# Patient Record
Sex: Male | Born: 1945
Health system: Southern US, Community
[De-identification: ages and names within clinical notes are randomized; demographics above are authoritative.]

## PROBLEM LIST (undated history)

## (undated) DIAGNOSIS — E7800 Pure hypercholesterolemia, unspecified: Secondary | ICD-10-CM

## (undated) DIAGNOSIS — I2510 Atherosclerotic heart disease of native coronary artery without angina pectoris: Secondary | ICD-10-CM

## (undated) DIAGNOSIS — N400 Enlarged prostate without lower urinary tract symptoms: Secondary | ICD-10-CM

## (undated) DIAGNOSIS — I10 Essential (primary) hypertension: Secondary | ICD-10-CM

## (undated) DIAGNOSIS — Z9861 Coronary angioplasty status: Secondary | ICD-10-CM

## (undated) HISTORY — PX: CARDIAC CATHETERIZATION: SHX172

## (undated) HISTORY — PX: HERNIA REPAIR: SHX51

## (undated) HISTORY — DX: Essential (primary) hypertension: I10

---

## 2001-06-22 ENCOUNTER — Encounter: Payer: Self-pay | Admitting: General Surgery

## 2001-06-22 ENCOUNTER — Ambulatory Visit (HOSPITAL_COMMUNITY): Admission: RE | Admit: 2001-06-22 | Discharge: 2001-06-22 | Payer: Self-pay | Admitting: General Surgery

## 2009-07-05 ENCOUNTER — Encounter: Admission: RE | Admit: 2009-07-05 | Discharge: 2009-07-05 | Payer: Self-pay | Admitting: Gastroenterology

## 2011-05-02 NOTE — Op Note (Signed)
[facility]  Patient:    Matthew Henderson, Matthew Henderson                     MRN: [id number] Proc. Date: 06/22/01 Adm. Date:  [id number] Attending:  Glenna Fellows Tappan                           Operative Report  PREOPERATIVE DIAGNOSIS:  Right inguinal hernia.  POSTOPERATIVE DIAGNOSIS:  Right inguinal hernia.  PROCEDURE:  Repair of right inguinal hernia.  SURGEON:  Lorne Skeens. Hoxworth, M.D.  ANESTHESIA:  Local with IV sedation.  BRIEF HISTORY:  [name] is a 65 year old black male who presents with an enlarging symptomatic right inguinal hernia.  Repair and local anesthesia with sedation using mesh has been recommended and accepted.  The nature of the procedure, indications, the risks of bleeding, infection, and recurrence were discussed and understood preoperatively.  He is now brought to the operating room for this procedure.  DESCRIPTION OF PROCEDURE:  The patient was brought to the operating room and placed in supine position on the operating room table and IV sedation was administered.  Antibiotics were given intravenously.  The right groin was sterilely prepped and draped.  Local anesthesia was used and infiltrated in the area of the incision and blocked the ilioinguinal nerve.  An oblique incision was made in the right groin and dissection carried down through the subcutaneous tissue.  Crossing veins were doubly clamped, divided, and ligated with 3-0 Vicryl.  The external oblique was identified, anesthetized, and divided along the lines of its fibers to the external ring. The ilioinguinal nerve was identified, dissected free, and protected.  The cord was dissected away from the floor of the pubic tubercle and cremasteric attachments divided.  There was a very large direct defect encompassing the entire floor.  The direct hernia content was dissected away from surrounding soft tissue and completely freed from the cord.  Following this,  the attenuated transversalis fascia was closed with running 2-0 Prolene, holding the hernia reduced.  There was no indirect component.  A piece of Prolene mesh was trimmed to size to fit the floor of the canal with tails around the cord and internal ring.  It was sutured initially to the pubic tubercle and then to the inguinal ligament with running 2-0 Prolene.  Immediately the mesh was sutured to the edge of the rectus sheath with interrupted 2-0 Prolene.  The tails were then packed together lateral to the cord with interrupted Prolene creating a new internal ring snug to a fingertip.  This provided nice wide coverage of the direct and indirect spaces.  The wound was irrigated and inspected for hemostasis which was complete.  The cord and ilioinguinal nerves were returned to the anatomic position.  The external oblique was closed with running 3-0 Vicryl, Scarpas fascia was closed with running 3-0 Vicryl, and the skin with running subcuticular 4-0 Monocryl and Steri-Strips.  Sponge and instrument counts correct.  A dry sterile dressing was applied and the patient was taken to the recovery room in good condition. DD:  06/22/01 TD:  06/22/01 Job: [id number]

## 2013-06-07 ENCOUNTER — Emergency Department (HOSPITAL_COMMUNITY): Payer: BC Managed Care – PPO

## 2013-06-07 ENCOUNTER — Emergency Department (HOSPITAL_COMMUNITY)
Admission: EM | Admit: 2013-06-07 | Discharge: 2013-06-07 | Disposition: A | Discharge status: Home / Self Care | Payer: BC Managed Care – PPO | Attending: Emergency Medicine | Admitting: Emergency Medicine

## 2013-06-07 ENCOUNTER — Encounter (HOSPITAL_COMMUNITY): Payer: Self-pay | Admitting: *Deleted

## 2013-06-07 DIAGNOSIS — Z7982 Long term (current) use of aspirin: Secondary | ICD-10-CM | POA: Insufficient documentation

## 2013-06-07 DIAGNOSIS — R42 Dizziness and giddiness: Secondary | ICD-10-CM | POA: Insufficient documentation

## 2013-06-07 DIAGNOSIS — E7800 Pure hypercholesterolemia, unspecified: Secondary | ICD-10-CM | POA: Insufficient documentation

## 2013-06-07 DIAGNOSIS — R110 Nausea: Secondary | ICD-10-CM | POA: Insufficient documentation

## 2013-06-07 DIAGNOSIS — Z79899 Other long term (current) drug therapy: Secondary | ICD-10-CM | POA: Insufficient documentation

## 2013-06-07 HISTORY — DX: Pure hypercholesterolemia, unspecified: E78.00

## 2013-06-07 LAB — POCT I-STAT, CHEM 8
Calcium, Ion: 1.11 mmol/L — ABNORMAL LOW (ref 1.13–1.30)
Glucose, Bld: 149 mg/dL — ABNORMAL HIGH (ref 70–99)
HCT: 47 % (ref 39.0–52.0)
Hemoglobin: 16 g/dL (ref 13.0–17.0)
TCO2: 22 mmol/L (ref 0–100)

## 2013-06-07 LAB — POCT I-STAT TROPONIN I: Troponin i, poc: 0.01 ng/mL (ref 0.00–0.08)

## 2013-06-07 MED ORDER — MECLIZINE HCL 50 MG PO TABS: 50.0000 mg | ORAL_TABLET | Freq: Three times a day (TID) | ORAL | Status: DC | PRN

## 2013-06-07 MED ORDER — MECLIZINE HCL 25 MG PO TABS
25.0000 mg | ORAL_TABLET | Freq: Once | ORAL | Status: AC
  Administered 2013-06-07: 25 mg via ORAL
  Filled 2013-06-07: qty 1

## 2013-06-07 NOTE — ED Notes (Signed)
Phlebotomy at bedside.

## 2013-06-07 NOTE — ED Notes (Signed)
MD at bedside. 

## 2013-06-07 NOTE — ED Notes (Signed)
Pt reports dizziness has resolved slightly.

## 2013-06-07 NOTE — ED Notes (Signed)
To ED for eval after waking with severe dizziness. States if he moves his head side to side the dizziness becomes extreme. Denies CP. Denies SOB. Denies HA. Denies visual changes. Speech is clear. No arm drift noted. Pupils equal.

## 2013-06-07 NOTE — ED Provider Notes (Signed)
History    CSN: [id number] Arrival date & time 06/07/13  0865  First MD Initiated Contact with Patient 06/07/13 1044     Chief Complaint  Patient presents with  . Dizziness   (Consider location/radiation/quality/duration/timing/severity/associated sxs/prior Treatment) HPI  Patient presents today complaining of dizzines consisting of there room off balance causing balance difficulties since awakening this a.m.   Some mild nausea no vomiting.  Worsens with head movement and walking.  NO numbness, tingling weakness.  Patient has not had similar episodes in past.  No history of stroke or tia.  No visual changes.  Patient denies nasal congestion or recent uri.  Problem has been present for several hours, waxing and waning.  No treatments given.   Past Medical History  Diagnosis Date  . Hypercholesteremia    History reviewed. No pertinent past surgical history. History reviewed. No pertinent family history. History  Substance Use Topics  . Smoking status: Never Smoker   . Smokeless tobacco: Not on file  . Alcohol Use: No    Review of Systems  All other systems reviewed and are negative.    Allergies  Review of patient's allergies indicates not on file.  Home Medications   Current Outpatient Rx  Name  Route  Sig  Dispense  Refill  . aspirin EC 81 MG tablet   Oral   Take 81 mg by mouth daily.         [name] dutasteride (AVODART) 0.5 MG capsule   Oral   Take 0.5 mg by mouth daily.         [name] ezetimibe-simvastatin (VYTORIN) 10-80 MG per tablet   Oral   Take 1 tablet by mouth daily.         [name] ibuprofen (ADVIL,MOTRIN) 200 MG tablet   Oral   Take 400 mg by mouth every 6 (six) hours as needed for pain.          BP 178/96  Pulse 95  Temp(Src) 97.8 F (36.6 C) (Oral)  Resp 18  Ht 6' (1.829 m)  Wt 254 lb (115.214 kg)  BMI 34.44 kg/m2  SpO2 99% Physical Exam  Nursing note and vitals reviewed. Constitutional: He is oriented to person, place, and time. He appears  well-developed and well-nourished.  HENT:  Head: Normocephalic and atraumatic.  Right Ear: Tympanic membrane and external ear normal.  Left Ear: Tympanic membrane and external ear normal.  Nose: Nose normal. Right sinus exhibits no maxillary sinus tenderness and no frontal sinus tenderness. Left sinus exhibits no maxillary sinus tenderness and no frontal sinus tenderness.  Eyes: Conjunctivae and EOM are normal. Pupils are equal, round, and reactive to light. Right eye exhibits no nystagmus. Left eye exhibits no nystagmus.  Neck: Normal range of motion. Neck supple.  Cardiovascular: Normal rate, regular rhythm, normal heart sounds and intact distal pulses.   Pulmonary/Chest: Effort normal and breath sounds normal. No respiratory distress. He exhibits no tenderness.  Abdominal: Soft. Bowel sounds are normal. He exhibits no distension and no mass. There is no tenderness.  Musculoskeletal: Normal range of motion. He exhibits no edema and no tenderness.  Neurological: He is alert and oriented to person, place, and time. He has normal strength and normal reflexes. No sensory deficit. He displays a negative Romberg sign. GCS eye subscore is 4. GCS verbal subscore is 5. GCS motor subscore is 6.  Reflex Scores:      Tricep reflexes are 2+ on the right side and 2+ on the left side.  Bicep reflexes are 2+ on the right side and 2+ on the left side.      Brachioradialis reflexes are 2+ on the right side and 2+ on the left side.      Patellar reflexes are 2+ on the right side and 2+ on the left side.      Achilles reflexes are 2+ on the right side and 2+ on the left side. Patient with normal gait without ataxia, shuffling, spasm, or antalgia. Speech is normal without dysarthria, dysphasia, or aphasia. Muscle strength is 5/5 in bilateral shoulders, elbow flexor and extensors, wrist flexor and extensors, and intrinsic hand muscles. 5/5 bilateral lower extremity hip flexors, extensors, knee flexors and  extensors, and ankle dorsi and plantar flexors.    Skin: Skin is warm and dry. No rash noted.  Psychiatric: He has a normal mood and affect. His behavior is normal. Judgment and thought content normal.    ED Course  Procedures (including critical care time) Labs Reviewed  POCT I-STAT, CHEM 8 - Abnormal; Notable for the following:    Glucose, Bld 149 (*)    Calcium, Ion 1.11 (*)    All other components within normal limits  POCT I-STAT TROPONIN I   Ct Head Wo Contrast  06/07/2013   *RADIOLOGY REPORT*  Clinical Data: Workup with severe dizziness  CT HEAD WITHOUT CONTRAST  Technique:  Contiguous axial images were obtained from the base of the skull through the vertex without contrast.  Comparison: None.  Findings: No acute intracranial hemorrhage.  No focal mass lesion. No CT evidence of acute infarction.   No midline shift or mass effect.  No hydrocephalus.  Basilar cisterns are patent.  Paranasal sinuses and mastoid air cells are clear.  Orbits are normal.  There is mild subcortical white matter hypodensities.  IMPRESSION: No acute intracranial findings.  Mild microvascular white matter disease.   Original Report Authenticated By: Genevive Bi, M.D.   Mr Brain Wo Contrast  06/07/2013   *RADIOLOGY REPORT*  Clinical Data: Dizziness.  Nausea.  Evaluate for vertebrobasilar insufficiency.  MRI HEAD WITHOUT CONTRAST  Technique:  Multiplanar, multiecho pulse sequences of the brain and surrounding structures were obtained according to standard protocol without intravenous contrast.  Comparison: CT head earlier today.  Findings: There is no evidence for acute infarction, intracranial hemorrhage, mass lesion, hydrocephalus, or extra-axial fluid. There is no cerebral or cerebellar atrophy.  Increased signal in the periventricular and subcortical white matter are noted, likely chronic microvascular ischemic change. There are numerous foci of T2* effect on GRE sequence in the supratentorial subcortical white  matter, along with marked prominence perivascular spaces suggesting hypertensive related cerebrovascular disease.  No evidence for hypertensive encephalopathy.  Calvarium is intact.  Skull base and upper cervical region unremarkable.  Normal pituitary and cerebellar tonsils.  No midline shift.  Flow voids are maintained in the major intracranial vessels which are moderate dolichoectatic.  Negative orbits.  Sinuses and mastoids clear.  IMPRESSION: No acute intracranial findings.  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  No evidence for hypertensive encephalopathy.   Original Report Authenticated By: Davonna Belling, M.D.   No diagnosis found.  MDM  Vertigo Plan f/u for hypertension and antivert.   Hilario Quarry, MD 06/09/13 [id number]

## 2014-07-10 ENCOUNTER — Other Ambulatory Visit: Payer: Self-pay | Admitting: Gastroenterology

## 2014-07-10 DIAGNOSIS — R195 Other fecal abnormalities: Secondary | ICD-10-CM

## 2014-07-13 ENCOUNTER — Other Ambulatory Visit: Payer: Self-pay | Admitting: Gastroenterology

## 2014-07-13 DIAGNOSIS — R195 Other fecal abnormalities: Secondary | ICD-10-CM

## 2014-07-13 DIAGNOSIS — K562 Volvulus: Secondary | ICD-10-CM

## 2014-07-18 ENCOUNTER — Ambulatory Visit
Admission: RE | Admit: 2014-07-18 | Discharge: 2014-07-18 | Disposition: A | Discharge status: Home / Self Care | Payer: Medicare Other | Source: Ambulatory Visit | Attending: Gastroenterology | Admitting: Gastroenterology

## 2014-07-18 DIAGNOSIS — R195 Other fecal abnormalities: Secondary | ICD-10-CM

## 2014-07-18 DIAGNOSIS — K562 Volvulus: Secondary | ICD-10-CM

## 2015-05-27 ENCOUNTER — Encounter (HOSPITAL_COMMUNITY): Payer: Self-pay | Admitting: Emergency Medicine

## 2015-05-27 ENCOUNTER — Emergency Department (HOSPITAL_COMMUNITY): Payer: Medicare Other

## 2015-05-27 ENCOUNTER — Emergency Department (HOSPITAL_COMMUNITY)
Admission: EM | Admit: 2015-05-27 | Discharge: 2015-05-27 | Disposition: A | Discharge status: Home / Self Care | Payer: Medicare Other | Attending: Emergency Medicine | Admitting: Emergency Medicine

## 2015-05-27 DIAGNOSIS — N400 Enlarged prostate without lower urinary tract symptoms: Secondary | ICD-10-CM | POA: Diagnosis not present

## 2015-05-27 DIAGNOSIS — Z7982 Long term (current) use of aspirin: Secondary | ICD-10-CM | POA: Insufficient documentation

## 2015-05-27 DIAGNOSIS — R61 Generalized hyperhidrosis: Secondary | ICD-10-CM

## 2015-05-27 DIAGNOSIS — R531 Weakness: Secondary | ICD-10-CM

## 2015-05-27 DIAGNOSIS — E780 Pure hypercholesterolemia: Secondary | ICD-10-CM | POA: Diagnosis not present

## 2015-05-27 DIAGNOSIS — Z79899 Other long term (current) drug therapy: Secondary | ICD-10-CM | POA: Diagnosis not present

## 2015-05-27 DIAGNOSIS — R110 Nausea: Secondary | ICD-10-CM | POA: Diagnosis present

## 2015-05-27 HISTORY — DX: Benign prostatic hyperplasia without lower urinary tract symptoms: N40.0

## 2015-05-27 LAB — URINE MICROSCOPIC-ADD ON

## 2015-05-27 LAB — URINALYSIS, ROUTINE W REFLEX MICROSCOPIC
BILIRUBIN URINE: NEGATIVE
GLUCOSE, UA: NEGATIVE mg/dL
HGB URINE DIPSTICK: NEGATIVE
Ketones, ur: NEGATIVE mg/dL
LEUKOCYTES UA: NEGATIVE
Nitrite: NEGATIVE
PH: 5.5 (ref 5.0–8.0)
PROTEIN: 30 mg/dL — AB
Specific Gravity, Urine: 1.021 (ref 1.005–1.030)
UROBILINOGEN UA: 1 mg/dL (ref 0.0–1.0)

## 2015-05-27 LAB — BASIC METABOLIC PANEL
Anion gap: 8 (ref 5–15)
BUN: 10 mg/dL (ref 6–20)
CO2: 23 mmol/L (ref 22–32)
CREATININE: 1.01 mg/dL (ref 0.61–1.24)
Calcium: 9.1 mg/dL (ref 8.9–10.3)
Chloride: 108 mmol/L (ref 101–111)
GFR calc Af Amer: >60 mL/min (ref >60)
GFR calc non Af Amer: >60 mL/min (ref >60)
Glucose, Bld: 112 mg/dL — ABNORMAL HIGH (ref 65–99)
POTASSIUM: 4.3 mmol/L (ref 3.5–5.1)
Sodium: 139 mmol/L (ref 135–145)

## 2015-05-27 LAB — CBC
HCT: 43.7 % (ref 39.0–52.0)
HEMOGLOBIN: 14.9 g/dL (ref 13.0–17.0)
MCH: 28.7 pg (ref 26.0–34.0)
MCHC: 34.1 g/dL (ref 30.0–36.0)
MCV: 84 fL (ref 78.0–100.0)
Platelets: 161 10*3/uL (ref 150–400)
RBC: 5.2 MIL/uL (ref 4.22–5.81)
RDW: 15 % (ref 11.5–15.5)
WBC: 7.6 10*3/uL (ref 4.0–10.5)

## 2015-05-27 LAB — I-STAT TROPONIN, ED
TROPONIN I, POC: 0.02 ng/mL (ref 0.00–0.08)
Troponin i, poc: 0.02 ng/mL (ref 0.00–0.08)

## 2015-05-27 LAB — CK: Total CK: 231 U/L (ref 49–397)

## 2015-05-27 LAB — I-STAT CG4 LACTIC ACID, ED
LACTIC ACID, VENOUS: 1.11 mmol/L (ref 0.5–2.0)
Lactic Acid, Venous: 1.42 mmol/L (ref 0.5–2.0)

## 2015-05-27 MED ORDER — SODIUM CHLORIDE 0.9 % IV BOLUS (SEPSIS)
1000.0000 mL | Freq: Once | INTRAVENOUS | Status: AC
  Administered 2015-05-27: 1000 mL via INTRAVENOUS

## 2015-05-27 NOTE — ED Notes (Signed)
Xray at the bedside.

## 2015-05-27 NOTE — ED Notes (Signed)
Kathryn Cosby, PA at the bedside.  

## 2015-05-27 NOTE — ED Notes (Signed)
MD Plunkett at the bedside  

## 2015-05-27 NOTE — ED Notes (Signed)
Pt. Stated, i was sitting in church and felt dizzy, a little nausea, and sweating really bad . I really feel like something is not right.

## 2015-05-27 NOTE — ED Notes (Signed)
Phlebotomy at the bedside  

## 2015-05-27 NOTE — Discharge Instructions (Signed)
1. Medications: usual home medications 2. Treatment: rest, drink plenty of fluids,  3. Follow Up: Please followup with your primary doctor in 2 days for discussion of your diagnoses and further evaluation after today's visit; if you do not have a primary care doctor use the resource guide provided to find one; Please return to the ER for return of symptoms.  

## 2015-05-27 NOTE — ED Notes (Signed)
Unable to get blood ?

## 2015-05-27 NOTE — ED Notes (Signed)
Called [place]. Phlebotomy already took patient's blood. Specimen collected.

## 2015-05-27 NOTE — ED Provider Notes (Signed)
CSN: [id number]     Arrival date & time 05/27/15  1148 History   First MD Initiated Contact with Patient 05/27/15 1216     Chief Complaint  Patient presents with  . Dizziness  . Excessive Sweating    hands only  . Nausea     (Consider location/radiation/quality/duration/timing/severity/associated sxs/prior Treatment) Patient is a 69 y.o. male presenting with dizziness. The history is provided by the patient and medical records. No language interpreter was used.  Dizziness Associated symptoms: nausea   Associated symptoms: no chest pain, no diarrhea, no headaches, no shortness of breath and no vomiting      Matthew Henderson is a 69 y.o. male  with a hx of hypercholesteremia presents to the Emergency Department complaining of gradual, persistent lightheadedness with associated nausea and diaphoresis that resolved prior to arrival onset 10 am this morning while reading the paper.  Pt reports symptoms worsened while at church.  Pt reports he feels normal now.  He ate a small breakfast and his wife reports he was outside all day yesterday in the heat.  He denies CP or SOB.  He also denies near syncope.  He has never had a cardiac work-up and does not have a cardiologist.  While they were present, nothing made the symptoms better or worse.  Pt denies fever, chills, headache, neck pain, chest pain, SOB, abd pain, vomiting, diarrhea, weakness, dizziness, syncope.      Past Medical History  Diagnosis Date  . Hypercholesteremia   . Prostate enlargement    History reviewed. No pertinent past surgical history. No family history on file. History  Substance Use Topics  . Smoking status: Never Smoker   . Smokeless tobacco: Not on file  . Alcohol Use: No    Review of Systems  Constitutional: Positive for diaphoresis. Negative for fever, appetite change, fatigue and unexpected weight change.  HENT: Negative for mouth sores.   Eyes: Negative for visual disturbance.  Respiratory: Negative for  cough, chest tightness, shortness of breath and wheezing.   Cardiovascular: Negative for chest pain.  Gastrointestinal: Positive for nausea. Negative for vomiting, abdominal pain, diarrhea and constipation.  Endocrine: Negative for polydipsia, polyphagia and polyuria.  Genitourinary: Negative for dysuria, urgency, frequency and hematuria.  Musculoskeletal: Negative for back pain and neck stiffness.  Skin: Negative for rash.  Allergic/Immunologic: Negative for immunocompromised state.  Neurological: Positive for light-headedness. Negative for dizziness, syncope and headaches.  Hematological: Does not bruise/bleed easily.  Psychiatric/Behavioral: Negative for sleep disturbance. The patient is not nervous/anxious.       Allergies  Review of patient's allergies indicates no known allergies.  Home Medications   Prior to Admission medications   Medication Sig Start Date End Date Taking? Authorizing Provider  aspirin EC 81 MG tablet Take 81 mg by mouth daily.   Yes Historical Provider, MD  dutasteride (AVODART) 0.5 MG capsule Take 0.5 mg by mouth daily.   Yes Historical Provider, MD  ezetimibe-simvastatin (VYTORIN) 10-80 MG per tablet Take 1 tablet by mouth daily.   Yes Historical Provider, MD  ibuprofen (ADVIL,MOTRIN) 200 MG tablet Take 400 mg by mouth every 6 (six) hours as needed for pain.   Yes Historical Provider, MD  Omega-3 Fatty Acids (FISH OIL PO) Take 2 capsules by mouth daily.   Yes Historical Provider, MD   BP 159/83 mmHg  Pulse 67  Temp(Src) 97.7 F (36.5 C) (Oral)  Resp 18  SpO2 97% Physical Exam  Constitutional: He appears well-developed and well-nourished. No distress.  Awake, alert, nontoxic appearance  HENT:  Head: Normocephalic and atraumatic.  Mouth/Throat: Oropharynx is clear and moist. No oropharyngeal exudate.  Eyes: Conjunctivae are normal. No scleral icterus.  Neck: Normal range of motion. Neck supple.  Cardiovascular: Normal rate, regular rhythm and intact  distal pulses.   Pulmonary/Chest: Effort normal and breath sounds normal. No respiratory distress. He has no wheezes.  Equal chest expansion  Abdominal: Soft. Bowel sounds are normal. He exhibits no mass. There is no tenderness. There is no rebound and no guarding.  Musculoskeletal: Normal range of motion. He exhibits no edema.  Neurological: He is alert.  Mental Status:  Alert, oriented, thought content appropriate, able to give a coherent history. Speech fluent without evidence of aphasia. Able to follow 2 step commands without difficulty.  Cranial Nerves:  II:  Peripheral visual fields grossly normal, pupils equal, round, reactive to light III,IV, VI: ptosis not present, extra-ocular motions intact bilaterally  V,VII: smile symmetric, facial light touch sensation equal VIII: hearing grossly normal to voice  X: uvula elevates symmetrically  XI: bilateral shoulder shrug symmetric and strong XII: midline tongue extension without fassiculations Motor:  Normal tone. 5/5 in upper and lower extremities bilaterally including strong and equal grip strength and dorsiflexion/plantar flexion Sensory: Pinprick and light touch normal in all extremities.  Deep Tendon Reflexes: 2+ and symmetric in the biceps and patella Cerebellar: normal finger-to-nose with bilateral upper extremities Gait: normal gait and balance CV: distal pulses palpable throughout  No clonus  Skin: Skin is warm and dry. He is not diaphoretic.  Psychiatric: He has a normal mood and affect.  Nursing note and vitals reviewed.   ED Course  Procedures (including critical care time) Labs Review Labs Reviewed  BASIC METABOLIC PANEL - Abnormal; Notable for the following:    Glucose, Bld 112 (*)    All other components within normal limits  URINALYSIS, ROUTINE W REFLEX MICROSCOPIC (NOT AT [facility]) - Abnormal; Notable for the following:    Protein, ur 30 (*)    All other components within normal limits  CBC  URINE MICROSCOPIC-ADD  ON  CK  I-STAT TROPOININ, ED  I-STAT CG4 LACTIC ACID, ED  Rosezena Sensor, ED  I-STAT CG4 LACTIC ACID, ED    Imaging Review Dg Chest Port 1 View  05/27/2015   CLINICAL DATA:  69 year old male with dizziness and nausea. Sweating.  EXAM: PORTABLE CHEST - 1 VIEW  COMPARISON:  No priors.  FINDINGS: Lung volumes are normal. No consolidative airspace disease. No pleural effusions. No pneumothorax. No pulmonary nodule or mass noted. Pulmonary vasculature and the cardiomediastinal silhouette are within normal limits.  IMPRESSION: No radiographic evidence of acute cardiopulmonary disease.   Electronically Signed   By: Trudie Reed M.D.   On: 05/27/2015 12:41     EKG Interpretation   Date/Time:  Sunday May 27 2015 11:53:43 EDT Ventricular Rate:  80 PR Interval:  170 QRS Duration: 82 QT Interval:  388 QTC Calculation: 447 R Axis:   5 Text Interpretation:  Normal sinus rhythm Possible Anterior infarct , age  undetermined No significant change since last tracing Confirmed by  [facility]  MD, WHITNEY ([id number]) on 05/27/2015 12:09:41 PM      MDM   Final diagnoses:  Weakness  Diaphoresis   Matthew Henderson presents with symptoms concerning for cardiac etiology.  Normal neurologic exam and I doubt CVA.  Labs pending.  Pt is CP free at this time.    4:30 PM Patient is to be discharged with recommendation  to follow up with PCP and cardiology in regards to today's hospital visit. Chest pain does not require admission at this time d/t presentation, PERC negative, VSS, no tracheal deviation, no JVD or new murmur, RRR, breath sounds equal bilaterally, EKG without acute abnormalities, negative troponin, and negative CXR. Pt was offered inpatient admission and work-up, but declines asking for outpatient follow-up.    Patient discussed with Dr. Eden Emms who will have the scheduler contact him with an appointment for next week.  Pt has been advised to return to the ED if CP becomes exertional,  associated with diaphoresis or nausea, radiates to left jaw/arm, worsens or becomes concerning in any way. Pt appears reliable for follow up and is agreeable to discharge.   Case has been discussed with and seen by Dr. Anitra Lauth who agrees with the above plan to discharge.    BP 159/83 mmHg  Pulse 67  Temp(Src) 97.7 F (36.5 C) (Oral)  Resp 18  SpO2 97%   Dierdre Forth, PA-C 05/27/15 1631  Gwyneth Sprout, MD 05/31/15 [phone]

## 2015-06-04 ENCOUNTER — Telehealth: Payer: Self-pay

## 2015-06-04 NOTE — Telephone Encounter (Signed)
New message    Per Dr. Eden Emms notes on  6.12.2016 . Pt need to be seen as new patient eval .   Call patient today he stated he does not need this appt will be discussion with his primary.    Attached message Needs new patient evaluation pre syncope seen in Er 6/12

## 2018-10-03 ENCOUNTER — Encounter (HOSPITAL_COMMUNITY): Payer: Self-pay

## 2018-10-03 ENCOUNTER — Emergency Department (HOSPITAL_COMMUNITY): Payer: Medicare Other

## 2018-10-03 ENCOUNTER — Other Ambulatory Visit: Payer: Self-pay

## 2018-10-03 ENCOUNTER — Emergency Department (HOSPITAL_COMMUNITY)
Admission: EM | Admit: 2018-10-03 | Discharge: 2018-10-04 | Disposition: A | Discharge status: Home / Self Care | Payer: Medicare Other | Attending: Emergency Medicine | Admitting: Emergency Medicine

## 2018-10-03 DIAGNOSIS — Z79899 Other long term (current) drug therapy: Secondary | ICD-10-CM | POA: Insufficient documentation

## 2018-10-03 DIAGNOSIS — R42 Dizziness and giddiness: Secondary | ICD-10-CM

## 2018-10-03 DIAGNOSIS — R0789 Other chest pain: Secondary | ICD-10-CM

## 2018-10-03 DIAGNOSIS — R079 Chest pain, unspecified: Secondary | ICD-10-CM | POA: Diagnosis present

## 2018-10-03 DIAGNOSIS — R51 Headache: Secondary | ICD-10-CM | POA: Diagnosis not present

## 2018-10-03 DIAGNOSIS — R519 Headache, unspecified: Secondary | ICD-10-CM

## 2018-10-03 LAB — BASIC METABOLIC PANEL
Anion gap: 5 (ref 5–15)
BUN: 16 mg/dL (ref 8–23)
CHLORIDE: 109 mmol/L (ref 98–111)
CO2: 27 mmol/L (ref 22–32)
CREATININE: 1.1 mg/dL (ref 0.61–1.24)
Calcium: 8.7 mg/dL — ABNORMAL LOW (ref 8.9–10.3)
GFR calc Af Amer: >60 mL/min (ref >60)
GFR calc non Af Amer: >60 mL/min (ref >60)
GLUCOSE: 109 mg/dL — AB (ref 70–99)
Potassium: 3.7 mmol/L (ref 3.5–5.1)
Sodium: 141 mmol/L (ref 135–145)

## 2018-10-03 LAB — CBC
HEMATOCRIT: 41.6 % (ref 39.0–52.0)
Hemoglobin: 13.3 g/dL (ref 13.0–17.0)
MCH: 28.6 pg (ref 26.0–34.0)
MCHC: 32 g/dL (ref 30.0–36.0)
MCV: 89.5 fL (ref 80.0–100.0)
Platelets: 151 10*3/uL (ref 150–400)
RBC: 4.65 MIL/uL (ref 4.22–5.81)
RDW: 14.3 % (ref 11.5–15.5)
WBC: 7.5 10*3/uL (ref 4.0–10.5)
nRBC: 0 % (ref 0.0–0.2)

## 2018-10-03 LAB — I-STAT TROPONIN, ED: Troponin i, poc: 0.01 ng/mL (ref 0.00–0.08)

## 2018-10-03 NOTE — ED Triage Notes (Signed)
Pt reports generalized chest pain that started acutely around 8p, but has since subsided. He also states that he had SOB, bilateral arm tingling and nausea during the episode. A&Ox4. Ambulatory.

## 2018-10-04 LAB — I-STAT TROPONIN, ED: Troponin i, poc: 0.01 ng/mL (ref 0.00–0.08)

## 2018-10-04 MED ORDER — ASPIRIN EC 81 MG PO TBEC: 324.0000 mg | DELAYED_RELEASE_TABLET | Freq: Every day | ORAL | 0 refills | Status: DC

## 2018-10-04 MED ORDER — HYDROCHLOROTHIAZIDE 25 MG PO TABS: 25.0000 mg | ORAL_TABLET | Freq: Every day | ORAL | 0 refills | Status: DC

## 2018-10-04 NOTE — ED Provider Notes (Signed)
[facility]-EMERGENCY DEPT Provider Note   CSN: [id number] Arrival date & time: 10/03/18  2135     History   Chief Complaint Chief Complaint  Patient presents with  . Chest Pain    HPI Matthew DICENZO is a 72 y.o. male.  HPI  72 year old male with history of hyperlipidemia and prostate enlargement comes in with chief complaint of chest discomfort and dizziness. Patient reports that around 8 PM he suddenly had generalized chest discomfort which he is unable to describe properly.  The symptoms were not only subjected to his anterior chest, but they were radiating towards his head and he had an associated episode of dizziness, nausea, shortness of breath and tingling in both of his hands.  He also felt that he might have gotten a little clammy.  His symptoms lasted for about 30 minutes and then resolved on their own.  Patient denies any known history of coronary artery disease.  He states that he is active and golfs about 3 times a week and has not had any exertional chest pain or shortness of breath over the past few days.   Patient denies any vision changes, slurred speech, focal numbness or weakness, or near fainting type symptoms.  There is no history of TIA-stroke.  Past Medical History:  Diagnosis Date  . Hypercholesteremia   . Prostate enlargement     There are no active problems to display for this patient.   History reviewed. No pertinent surgical history.      Home Medications    Prior to Admission medications   Medication Sig Start Date End Date Taking? Authorizing Provider  ezetimibe-simvastatin (VYTORIN) 10-80 MG per tablet Take 1 tablet by mouth daily.   Yes [provider]  finasteride (PROSCAR) 5 MG tablet Take 5 mg by mouth daily. 07/09/18  Yes [provider]  Omega-3 Fatty Acids (FISH OIL PO) Take 2 capsules by mouth daily.   Yes [provider]  aspirin EC 81 MG tablet Take 4 tablets (324 mg total) by  mouth daily. 10/04/18   Derwood Kaplan, MD  hydrochlorothiazide (HYDRODIURIL) 25 MG tablet Take 1 tablet (25 mg total) by mouth daily. 10/04/18   Derwood Kaplan, MD    Family History History reviewed. No pertinent family history.  Social History Social History   Tobacco Use  . Smoking status: Never Smoker  Substance Use Topics  . Alcohol use: No  . Drug use: No     Allergies   Patient has no known allergies.   Review of Systems Review of Systems  Constitutional: Positive for activity change.  Eyes: Negative for visual disturbance.  Respiratory: Positive for shortness of breath.   Cardiovascular: Positive for chest pain.  Gastrointestinal: Positive for nausea.  Neurological: Positive for dizziness, light-headedness and headaches.  All other systems reviewed and are negative.    Physical Exam Updated Vital Signs BP (!) 164/84 (BP Location: Left Arm)   Pulse 72   Temp 98 F (36.7 C) (Oral)   Resp 19   SpO2 98%   Physical Exam  Constitutional: He is oriented to person, place, and time. He appears well-developed.  HENT:  Head: Atraumatic.  Neck: Neck supple.  Cardiovascular: Normal rate, intact distal pulses and normal pulses.  Pulmonary/Chest: Effort normal.  Musculoskeletal:       Right lower leg: He exhibits no tenderness and no edema.       Left lower leg: He exhibits no tenderness and no edema.  Neurological: He is  alert and oriented to person, place, and time.  Cerebellar exam is normal (finger to nose) Sensory exam normal for bilateral upper and lower extremities - and patient is able to discriminate between sharp and dull. Motor exam is 4+/5   Skin: Skin is warm.  Nursing note and vitals reviewed.    ED Treatments / Results  Labs (all labs ordered are listed, but only abnormal results are displayed) Labs Reviewed  BASIC METABOLIC PANEL - Abnormal; Notable for the following components:      Result Value   Glucose, Bld 109 (*)    Calcium 8.7  (*)    All other components within normal limits  CBC  I-STAT TROPONIN, ED  I-STAT TROPONIN, ED    EKG EKG Interpretation  Date/Time:  Sunday October 03 2018 21:40:24 EDT Ventricular Rate:  81 PR Interval:    QRS Duration: 97 QT Interval:  411 QTC Calculation: 478 R Axis:   10 Text Interpretation:  Sinus rhythm Abnormal R-wave progression, early transition Borderline prolonged QT interval No acute changes Confirmed by Derwood Kaplan [phone]) on 10/03/2018 11:04:44 PM   Radiology Dg Chest 2 View  Result Date: 10/03/2018 CLINICAL DATA:  Chest pain EXAM: CHEST - 2 VIEW COMPARISON:  05/27/2015 FINDINGS: Heart and mediastinal contours are within normal limits. No focal opacities or effusions. No acute bony abnormality. IMPRESSION: No active cardiopulmonary disease. Electronically Signed   By: Charlett Nose M.D.   On: 10/03/2018 22:44    Procedures Procedures (including critical care time)  Medications Ordered in ED Medications - No data to display   Initial Impression / Assessment and Plan / ED Course  I have reviewed the triage vital signs and the nursing notes.  Pertinent labs & imaging results that were available during my care of the patient were reviewed by me and considered in my medical decision making (see chart for details).  Clinical Course as of Oct 04 220  Mon Oct 04, 2018  0213 Results from the ER workup discussed with the patient face to face and all questions answered to the best of my ability.  They are comfortable with the close follow-up.  We went over red flags that should lead to a return ER visit immediately.  Patient will come back to the ER if he starts having any focal numbness/weakness, slurred speech, fainting, worsening chest pain, shortness of breath, jaw pain.   [AN]    Clinical Course User Index [AN] Derwood Kaplan, MD    72 year old male comes in with chief complaint of chest discomfort.  His symptoms are nonspecific and lasted for about  30 minutes.  Patient had associated radiation of the pain towards his head with nausea, shortness of breath, tingling in both of his fingers and clamminess.  He is relatively healthy man with only history of high blood pressure and no known history of CAD or CVA.  He has a nonfocal and normal neurologic exam and at the moment he is chest pain-free with initial troponin and EKG are negative for any acute findings.  Differential diagnosis includes ACS along with TIA.  The symptoms are very vague, but they appear to be either cardiovascular or neurovascular in etiology.  Other possibilities include transient arrhythmia.  Plan is for patient to follow-up with cardiology and his PCP for further work-up, if patient symptoms free and the delta troponin is negative. Patient and family are comfortable with the plan.  Final Clinical Impressions(s) / ED Diagnoses   Final diagnoses:  Atypical chest pain  Dizziness  Nonintractable headache, unspecified chronicity pattern, unspecified headache type    ED Discharge Orders         Ordered    hydrochlorothiazide (HYDRODIURIL) 25 MG tablet  Daily     10/04/18 0221    aspirin EC 81 MG tablet  Daily     10/04/18 0221           Derwood Kaplan, MD 10/04/18 0222

## 2018-10-04 NOTE — Discharge Instructions (Addendum)
We saw in the ER for an episode of chest discomfort with headache and associated nonspecific symptoms.   As discussed, besides the elevated blood pressure all the other results in the ER have been normal for any cardiac damage.  EKG and electrolytes are normal as well.  We do not know what caused you to have this transient episode.  We think it is prudent for you to follow-up with your primary care doctor within the next 1 week for a reassessment.  If your doctor thinks that the symptoms were because of blockage of the blood vessels going to the brain, they can order appropriate test.   Additionally, we would like you to follow-up with cardiology doctors as well to ensure that you do not need further cardiac work-up.  Please return to the ER if you have worsening chest pain, shortness of breath, pain radiating to your jaw, shoulder, or back, sweats or fainting.  Also return to the ER if you start having one-sided numbness, weakness, slurred speech, constant dizziness or vision changes.

## 2018-10-30 ENCOUNTER — Emergency Department (HOSPITAL_COMMUNITY): Payer: Medicare Other

## 2018-10-30 ENCOUNTER — Encounter (HOSPITAL_COMMUNITY): Payer: Self-pay | Admitting: Emergency Medicine

## 2018-10-30 ENCOUNTER — Inpatient Hospital Stay (HOSPITAL_COMMUNITY)
Admission: EM | Admit: 2018-10-30 | Discharge: 2018-11-02 | DRG: 247 | Disposition: A | Discharge status: Home / Self Care | Payer: Medicare Other | Attending: Interventional Cardiology | Admitting: Interventional Cardiology

## 2018-10-30 ENCOUNTER — Other Ambulatory Visit: Payer: Self-pay

## 2018-10-30 DIAGNOSIS — Z8249 Family history of ischemic heart disease and other diseases of the circulatory system: Secondary | ICD-10-CM

## 2018-10-30 DIAGNOSIS — R42 Dizziness and giddiness: Secondary | ICD-10-CM

## 2018-10-30 DIAGNOSIS — R079 Chest pain, unspecified: Secondary | ICD-10-CM | POA: Diagnosis not present

## 2018-10-30 DIAGNOSIS — N400 Enlarged prostate without lower urinary tract symptoms: Secondary | ICD-10-CM | POA: Diagnosis present

## 2018-10-30 DIAGNOSIS — R000 Tachycardia, unspecified: Secondary | ICD-10-CM | POA: Diagnosis present

## 2018-10-30 DIAGNOSIS — R9389 Abnormal findings on diagnostic imaging of other specified body structures: Secondary | ICD-10-CM

## 2018-10-30 DIAGNOSIS — I200 Unstable angina: Secondary | ICD-10-CM

## 2018-10-30 DIAGNOSIS — I25110 Atherosclerotic heart disease of native coronary artery with unstable angina pectoris: Principal | ICD-10-CM | POA: Diagnosis present

## 2018-10-30 DIAGNOSIS — Z7982 Long term (current) use of aspirin: Secondary | ICD-10-CM

## 2018-10-30 DIAGNOSIS — Z79899 Other long term (current) drug therapy: Secondary | ICD-10-CM

## 2018-10-30 DIAGNOSIS — E785 Hyperlipidemia, unspecified: Secondary | ICD-10-CM | POA: Diagnosis not present

## 2018-10-30 DIAGNOSIS — N179 Acute kidney failure, unspecified: Secondary | ICD-10-CM | POA: Diagnosis present

## 2018-10-30 DIAGNOSIS — I2510 Atherosclerotic heart disease of native coronary artery without angina pectoris: Secondary | ICD-10-CM

## 2018-10-30 DIAGNOSIS — Z9861 Coronary angioplasty status: Secondary | ICD-10-CM

## 2018-10-30 DIAGNOSIS — N401 Enlarged prostate with lower urinary tract symptoms: Secondary | ICD-10-CM

## 2018-10-30 DIAGNOSIS — I10 Essential (primary) hypertension: Secondary | ICD-10-CM | POA: Diagnosis present

## 2018-10-30 DIAGNOSIS — R001 Bradycardia, unspecified: Secondary | ICD-10-CM | POA: Diagnosis not present

## 2018-10-30 DIAGNOSIS — R350 Frequency of micturition: Secondary | ICD-10-CM

## 2018-10-30 DIAGNOSIS — Z809 Family history of malignant neoplasm, unspecified: Secondary | ICD-10-CM

## 2018-10-30 DIAGNOSIS — I129 Hypertensive chronic kidney disease with stage 1 through stage 4 chronic kidney disease, or unspecified chronic kidney disease: Secondary | ICD-10-CM | POA: Diagnosis present

## 2018-10-30 DIAGNOSIS — R937 Abnormal findings on diagnostic imaging of other parts of musculoskeletal system: Secondary | ICD-10-CM | POA: Diagnosis present

## 2018-10-30 DIAGNOSIS — N182 Chronic kidney disease, stage 2 (mild): Secondary | ICD-10-CM | POA: Diagnosis present

## 2018-10-30 DIAGNOSIS — Z955 Presence of coronary angioplasty implant and graft: Secondary | ICD-10-CM

## 2018-10-30 HISTORY — DX: Coronary angioplasty status: Z98.61

## 2018-10-30 HISTORY — DX: Atherosclerotic heart disease of native coronary artery without angina pectoris: I25.10

## 2018-10-30 LAB — I-STAT TROPONIN, ED: Troponin i, poc: 0.02 ng/mL (ref 0.00–0.08)

## 2018-10-30 LAB — CBC
HCT: 41.1 % (ref 39.0–52.0)
Hemoglobin: 13.2 g/dL (ref 13.0–17.0)
MCH: 28.9 pg (ref 26.0–34.0)
MCHC: 32.1 g/dL (ref 30.0–36.0)
MCV: 89.9 fL (ref 80.0–100.0)
Platelets: 164 K/uL (ref 150–400)
RBC: 4.57 MIL/uL (ref 4.22–5.81)
RDW: 13.9 % (ref 11.5–15.5)
WBC: 8.4 K/uL (ref 4.0–10.5)
nRBC: 0 % (ref 0.0–0.2)

## 2018-10-30 LAB — BASIC METABOLIC PANEL
ANION GAP: 5 (ref 5–15)
BUN: 23 mg/dL (ref 8–23)
CHLORIDE: 106 mmol/L (ref 98–111)
CO2: 28 mmol/L (ref 22–32)
CREATININE: 1.36 mg/dL — AB (ref 0.61–1.24)
Calcium: 8.9 mg/dL (ref 8.9–10.3)
GFR calc non Af Amer: 51 mL/min — ABNORMAL LOW (ref >60)
GFR, EST AFRICAN AMERICAN: 59 mL/min — AB (ref >60)
Glucose, Bld: 113 mg/dL — ABNORMAL HIGH (ref 70–99)
POTASSIUM: 3.7 mmol/L (ref 3.5–5.1)
Sodium: 139 mmol/L (ref 135–145)

## 2018-10-30 NOTE — ED Provider Notes (Signed)
[facility] Provider Note MRN:  [id number]  Arrival date & time: 10/30/18     Chief Complaint   Chest Pain   History of Present Illness   Matthew DressMichael W Chain is a 72 y.o. year-old male with a history of hypertension, hyperlipidemia presenting to the ED with chief complaint of chest pain.  The pain began at 6 PM today while the patient was sitting peacefully watching television.  The pain is located in the center of the chest is nonradiating.  The pain is described as a pressure-like pain, 3 out of 10 in severity.  Patient took 4 baby aspirin.  Tried walking around but that made the pain worse.  The pain eventually resolved 2 hours later.  Currently chest pain-free.  Pain associated with lightheadedness, denies diaphoresis, no nausea, no vomiting, no shortness of breath.  Patient explains that he was seen in the emergency department last month for a similar episode, his work-up was unremarkable and he was instructed to follow-up with a cardiologist.  He has tried to do so, but his appointment has been delayed until December.  Review of Systems  A complete 10 system review of systems was obtained and all systems are negative except as noted in the HPI and PMH.   Patient's Health History    Past Medical History:  Diagnosis Date  . Hypercholesteremia   . Hypertension   . Prostate enlargement     Past Surgical History:  Procedure Laterality Date  . HERNIA REPAIR     OVER 20 YEARS AGO    Family History  Problem Relation Age of Onset  . Cancer Mother        BREAST  . Heart attack Father   . Heart disease Brother        STENTS  . Other Maternal Grandmother        OLD AGE    Social History   Socioeconomic History  . Marital status: Married    Spouse name: Not on file  . Number of children: Not on file  . Years of education: Not on file  . Highest education level: Not on file  Occupational History  . Not on file  Social Needs  .  Financial resource strain: Not on file  . Food insecurity:    Worry: Not on file    Inability: Not on file  . Transportation needs:    Medical: Not on file    Non-medical: Not on file  Tobacco Use  . Smoking status: Never Smoker  . Smokeless tobacco: Never Used  Substance and Sexual Activity  . Alcohol use: No  . Drug use: No  . Sexual activity: Not on file  Lifestyle  . Physical activity:    Days per week: Not on file    Minutes per session: Not on file  . Stress: Not on file  Relationships  . Social connections:    Talks on phone: Not on file    Gets together: Not on file    Attends religious service: Not on file    Active member of club or organization: Not on file    Attends meetings of clubs or organizations: Not on file    Relationship status: Not on file  . Intimate partner violence:    Fear of current or ex partner: Not on file    Emotionally abused: Not on file    Physically abused: Not on file    Forced sexual activity: Not on file  Other Topics Concern  . Not on file  Social History Narrative  . Not on file     Physical Exam  Vital Signs and Nursing Notes reviewed Vitals:   10/30/18 2216 10/30/18 2255  BP: (!) 163/84 (!) 156/80  Pulse: 68 65  Resp: 17 18  Temp:    SpO2: 100% 100%    CONSTITUTIONAL: Well-appearing, NAD NEURO:  Alert and oriented x 3, no focal deficits EYES:  eyes equal and reactive ENT/NECK:  no LAD, no JVD CARDIO: Regular rate, well-perfused, normal S1 and S2 PULM:  CTAB no wheezing or rhonchi GI/GU:  normal bowel sounds, non-distended, non-tender MSK/SPINE:  No gross deformities, no edema SKIN:  no rash, atraumatic PSYCH:  Appropriate speech and behavior  Diagnostic and Interventional Summary    EKG Interpretation  Date/Time:  Saturday October 30 2018 21:09:31 EST Ventricular Rate:  83 PR Interval:    QRS Duration: 96 QT Interval:  390 QTC Calculation: 459 R Axis:   12 Text Interpretation:  Sinus rhythm Ventricular  premature complex Baseline wander in lead(s) V6 Confirmed by Kennis Carina [phone]) on 10/30/2018 10:11:52 PM      Labs Reviewed  BASIC METABOLIC PANEL - Abnormal; Notable for the following components:      Result Value   Glucose, Bld 113 (*)    Creatinine, Ser 1.36 (*)    GFR calc non Af Amer 51 (*)    GFR calc Af Amer 59 (*)    All other components within normal limits  CBC  I-STAT TROPONIN, ED    DG Chest 2 View  Final Result      Medications - No data to display   Procedures Critical Care  ED Course and Medical Decision Making  I have reviewed the triage vital signs and the nursing notes.  Pertinent labs & imaging results that were available during my care of the patient were reviewed by me and considered in my medical decision making (see below for details).  Concern for cardiac chest pain in this 72 year old male with heart score of 5 (positive family history, hypertension, hyperlipidemia, age).  Quite appropriate to attempt outpatient management last month, but given that we have a recurrent episode and he has been unable to see a cardiologist despite trying, will need for stress testing tonight/tomorrow morning.  Admitted to hospitalist service for further care and evaluation.  Elmer Sow. Pilar Plate, MD [facility] Emergency Medicine [facility] [email or web address]  Final Clinical Impressions(s) / ED Diagnoses     ICD-10-CM   1. Chest pain, unspecified type R07.9     ED Discharge Orders    None         Sabas Sous, MD 10/30/18 2333

## 2018-10-30 NOTE — ED Triage Notes (Signed)
Pt presents with substernal chest pain with pressure without radiation. Pt also reported elevated blood pressure.

## 2018-10-30 NOTE — H&P (Signed)
Matthew DressMichael W Henderson WUJ:[id number] DOB: 12/31/1945 DOA: 10/30/2018     PCP: Clovis RileyMitchell, L.August Saucerean, MD   Outpatient Specialists:   CARDS:  Dr. Isabel CapriceVaranassi have not seen yet have app on Dec 4th    Patient arrived to ER on 10/30/18 at 2103  Patient coming from: home Lives   With family    Chief Complaint:  Chief Complaint  Patient presents with  . Chest Pain    HPI: Matthew DressMichael W Henderson is a 72 y.o. male with medical history significant of  hypertension, hyperlipidemia, BPH    Presented with chest pain noted at 6 PM while at rest located centrally nonradiating pressure-like 3 out of 10 in severity patient attempted to use 4 baby aspirins try to walk it off but it only made it worse 2 hours later the pain has resolved and currently he is chest pain-free while he was having chest pain he was feeling a little lightheaded but no nausea vomiting no shortness of breath no diaphoresis. Wife checked his BP it was 193/82 He had similar presentation to emergency department last month  At that time BP was in 200's he was started on HCTZ he was supposed to follow-up with cardiology but have not had an appointment until December. Has younger brother had to have stent put in at age of 70 he is not a smoker.    HE has been feeling light headed at times worse when he first wakes up. Have been having this problem for a while He has to wake up to 3 times at night to use a bathroom. He occasionally feels light headed while playing golf as well.    Regarding pertinent Chronic problems: no known hx of CAD. Never had a stress test. No Echo.     While in ER:  The following Work up has been ordered so far:  Orders Placed This Encounter  Procedures  . DG Chest 2 View  . Basic metabolic panel  . CBC  . Cardiac monitoring  . Saline Lock IV, Maintain IV access  . Encourage fluids  . Consult to hospitalist  . Pulse oximetry, continuous  . I-stat troponin, ED  . I-stat troponin, ED  . ED EKG within 10  minutes  . EKG 12-Lead     Following Medications were ordered in ER: Medications - No data to display  Significant initial  Findings: Abnormal Labs Reviewed  BASIC METABOLIC PANEL - Abnormal; Notable for the following components:      Result Value   Glucose, Bld 113 (*)    Creatinine, Ser 1.36 (*)    GFR calc non Af Amer 51 (*)    GFR calc Af Amer 59 (*)    All other components within normal limits     Lactic Acid, Venous    Component Value Date/Time   LATICACIDVEN 1.11 05/27/2015 1609    Na 139 K 3.7  Cr    Up from baseline see below Lab Results  Component Value Date   CREATININE 1.36 (H) 10/30/2018   CREATININE 1.10 10/03/2018   CREATININE 1.01 05/27/2015      WBC  8.4  HG/HCT  Stable     Component Value Date/Time   HGB 13.2 10/30/2018 2238   HCT 41.1 10/30/2018 2238    Troponin (Point of Care Test) Recent Labs    10/30/18 2246  TROPIPOC 0.02     BNP (last 3 results) No results for input(s): BNP in the last 8760 hours.  ProBNP (last 3  results) No results for input(s): PROBNP in the last 8760 hours.    UA  no evidence of UTI    CXR - NON acute    ECG:  Personally reviewed by me showing: HR : 83 Rhythm:  NSR,  no evidence of ischemic changes QTC 459   ED Triage Vitals  Enc Vitals Group     BP 10/30/18 2111 (!) 179/97     Pulse Rate 10/30/18 2111 83     Resp 10/30/18 2111 17     Temp 10/30/18 2111 97.8 F (36.6 C)     Temp Source 10/30/18 2111 Oral     SpO2 10/30/18 2111 100 %     Weight 10/30/18 2112 220 lb (99.8 kg)     Height 10/30/18 2112 6' (1.829 m)     Head Circumference --      Peak Flow --      Pain Score 10/30/18 2112 2     Pain Loc --      Pain Edu? --      Excl. in GC? --   TMAX(24)@       Latest  Blood pressure (!) 152/82, pulse 67, temperature 97.8 F (36.6 C), temperature source Oral, resp. rate 17, height 6' (1.829 m), weight 99.8 kg, SpO2 100 %.    Hospitalist was called for admission for Chest pain  evaluation    Review of Systems:    Pertinent positives include:fatigue, weight loss, light headedness, increased in urination   chest pain,  Constitutional:  No weight loss, night sweats, Fevers, chills, HEENT:  No headaches, Difficulty swallowing,Tooth/dental problems,Sore throat,  No sneezing, itching, ear ache, nasal congestion, post nasal drip,  Cardio-vascular:  No Orthopnea, PND, anasarca, dizziness, palpitations.no Bilateral lower extremity swelling  GI:  No heartburn, indigestion, abdominal pain, nausea, vomiting, diarrhea, change in bowel habits, loss of appetite, melena, blood in stool, hematemesis Resp:  no shortness of breath at rest. No dyspnea on exertion, No excess mucus, no productive cough, No non-productive cough, No coughing up of blood.No change in color of mucus. No wheezing. Skin:  no rash or lesions. No jaundice GU:  no dysuria, change in color of urine, no urgency or frequency. No straining to urinate.  No flank pain.  Musculoskeletal:  No joint pain or no joint swelling. No decreased range of motion. No back pain.  Psych:  No change in mood or affect. No depression or anxiety. No memory loss.  Neuro: no localizing neurological complaints, no tingling, no weakness, no double vision, no gait abnormality, no slurred speech, no confusion  All systems reviewed and apart from HOPI all are negative  Past Medical History:   Past Medical History:  Diagnosis Date  . Hypercholesteremia   . Hypertension   . Prostate enlargement       Past Surgical History:  Procedure Laterality Date  . HERNIA REPAIR     OVER 20 YEARS AGO    Social History:  Ambulatory  independently      reports that he has never smoked. He has never used smokeless tobacco. He reports that he does not drink alcohol or use drugs.     Family History:   Family History  Problem Relation Age of Onset  . Cancer Mother        BREAST  . Heart attack Father   . Heart disease Brother         STENTS  . Other Maternal Grandmother        OLD AGE  Allergies: No Known Allergies   Prior to Admission medications   Medication Sig Start Date End Date Taking? Authorizing Provider  aspirin EC 81 MG tablet Take 4 tablets (324 mg total) by mouth daily. 10/04/18   Derwood Kaplan, MD  ezetimibe-simvastatin (VYTORIN) 10-80 MG per tablet Take 1 tablet by mouth daily.    [provider]  finasteride (PROSCAR) 5 MG tablet Take 5 mg by mouth daily. 07/09/18   [provider]  hydrochlorothiazide (HYDRODIURIL) 25 MG tablet Take 1 tablet (25 mg total) by mouth daily. 10/04/18   Derwood Kaplan, MD  Omega-3 Fatty Acids (FISH OIL PO) Take 2 capsules by mouth daily.    [provider]   Physical Exam: Blood pressure (!) 152/82, pulse 67, temperature 97.8 F (36.6 C), temperature source Oral, resp. rate 17, height 6' (1.829 m), weight 99.8 kg, SpO2 100 %. 1. General:  in No Acute distress  well -appearing 2. Psychological: Alert and   Oriented 3. Head/ENT:   Dry Mucous Membranes                          Head Non traumatic, neck supple                          Poor Dentition 4. SKIN: normal   Skin turgor,  Skin clean Dry and intact no rash 5. Heart: Regular rate and rhythm no  Murmur, no Rub or gallop 6. Lungs:  no wheezes or crackles   7. Abdomen: Soft, non-tender, Non distended   obese  bowel sounds present 8. Lower extremities: no clubbing, cyanosis, or  edema 9. Neurologically Grossly intact, moving all 4 extremities equally  10. MSK: Normal range of motion   LABS:     Recent Labs  Lab 10/30/18 2238  WBC 8.4  HGB 13.2  HCT 41.1  MCV 89.9  PLT 164   Basic Metabolic Panel: Recent Labs  Lab 10/30/18 2238  NA 139  K 3.7  CL 106  CO2 28  GLUCOSE 113*  BUN 23  CREATININE 1.36*  CALCIUM 8.9      No results for input(s): AST, ALT, ALKPHOS, BILITOT, PROT, ALBUMIN in the last 168 hours. No results for input(s): LIPASE, AMYLASE in the  last 168 hours. No results for input(s): AMMONIA in the last 168 hours.    HbA1C: No results for input(s): HGBA1C in the last 72 hours. CBG: No results for input(s): GLUCAP in the last 168 hours.    Urine analysis:    Component Value Date/Time   COLORURINE YELLOW 05/27/2015 1238   APPEARANCEUR CLEAR 05/27/2015 1238   LABSPEC 1.021 05/27/2015 1238   PHURINE 5.5 05/27/2015 1238   GLUCOSEU NEGATIVE 05/27/2015 1238   HGBUR NEGATIVE 05/27/2015 1238   BILIRUBINUR NEGATIVE 05/27/2015 1238   KETONESUR NEGATIVE 05/27/2015 1238   PROTEINUR 30 (A) 05/27/2015 1238   UROBILINOGEN 1.0 05/27/2015 1238   NITRITE NEGATIVE 05/27/2015 1238   LEUKOCYTESUR NEGATIVE 05/27/2015 1238    Cultures: No results found for: SDES, SPECREQUEST, CULT, REPTSTATUS   Radiological Exams on Admission: Dg Chest 2 View  Result Date: 10/30/2018 CLINICAL DATA:  Acute onset of chest tightness and generalized weakness. EXAM: CHEST - 2 VIEW COMPARISON:  Chest radiograph performed 10/03/2018 FINDINGS: The lungs are well-aerated and clear. There is no evidence of focal opacification, pleural effusion or pneumothorax. The heart is normal in size; the mediastinal contour is within normal limits. No  acute osseous abnormalities are seen. IMPRESSION: No acute cardiopulmonary process seen. Electronically Signed   By: Roanna Raider M.D.   On: 10/30/2018 21:40    Chart has been reviewed    Assessment/Plan   72 y.o. male with medical history significant of  hypertension, hyperlipidemia, BPH Admitted for chest pain in the setting of elevated blood pressure poorly controlled hypertension  Present on Admission: . Chest pain -the setting of hypertension will need further cardiac work-up obtain echogram will benefit from cardiology consult in a.m. to determine if patient would benefit from stress testing cycle cardiac enzymes and monitor on telemetry . Hypertension -given light headedness will hold hydrochlorothiazide obtain  echogram.  Switch to Norvasc for now . BPH (benign prostatic hyperplasia) continue home medication patient will likely need to follow-up with urology . Hyperlipidemia's -stable continue home medications   Other plan as per orders.  DVT prophylaxis:   Lovenox     Code Status:  FULL CODE  as per patient   I had personally discussed CODE STATUS with patient and family  Family Communication:   Family   at  Bedside  plan of care was discussed with Wife,   Disposition Plan:       To home once workup is complete and patient is stable       Consults called: consult Cardiology in AM   Admission status:  Obs    Level of care     tele  For  24H         Shalona Harbour 10/31/2018, 12:39 AM    [facility]  Pager [phone]   after 2 AM please page floor coverage PA If 7AM-7PM, please contact the day team taking care of the patient  [email or web address]  Password [email or web address]

## 2018-10-31 ENCOUNTER — Encounter (HOSPITAL_COMMUNITY): Payer: Self-pay | Admitting: Internal Medicine

## 2018-10-31 ENCOUNTER — Observation Stay (HOSPITAL_COMMUNITY): Payer: Medicare Other

## 2018-10-31 ENCOUNTER — Other Ambulatory Visit (HOSPITAL_COMMUNITY): Payer: Self-pay | Admitting: Radiology

## 2018-10-31 ENCOUNTER — Observation Stay (HOSPITAL_BASED_OUTPATIENT_CLINIC_OR_DEPARTMENT_OTHER): Payer: Medicare Other

## 2018-10-31 DIAGNOSIS — I200 Unstable angina: Secondary | ICD-10-CM | POA: Diagnosis not present

## 2018-10-31 DIAGNOSIS — R079 Chest pain, unspecified: Secondary | ICD-10-CM

## 2018-10-31 DIAGNOSIS — R937 Abnormal findings on diagnostic imaging of other parts of musculoskeletal system: Secondary | ICD-10-CM | POA: Diagnosis present

## 2018-10-31 DIAGNOSIS — N400 Enlarged prostate without lower urinary tract symptoms: Secondary | ICD-10-CM | POA: Diagnosis present

## 2018-10-31 DIAGNOSIS — I2510 Atherosclerotic heart disease of native coronary artery without angina pectoris: Secondary | ICD-10-CM

## 2018-10-31 DIAGNOSIS — E785 Hyperlipidemia, unspecified: Secondary | ICD-10-CM | POA: Diagnosis not present

## 2018-10-31 DIAGNOSIS — R42 Dizziness and giddiness: Secondary | ICD-10-CM

## 2018-10-31 DIAGNOSIS — I10 Essential (primary) hypertension: Secondary | ICD-10-CM | POA: Diagnosis not present

## 2018-10-31 DIAGNOSIS — R000 Tachycardia, unspecified: Secondary | ICD-10-CM | POA: Diagnosis present

## 2018-10-31 DIAGNOSIS — N401 Enlarged prostate with lower urinary tract symptoms: Secondary | ICD-10-CM | POA: Diagnosis not present

## 2018-10-31 LAB — URINALYSIS, ROUTINE W REFLEX MICROSCOPIC
BACTERIA UA: NONE SEEN
Bilirubin Urine: NEGATIVE
GLUCOSE, UA: NEGATIVE mg/dL
HGB URINE DIPSTICK: NEGATIVE
KETONES UR: NEGATIVE mg/dL
Leukocytes, UA: NEGATIVE
NITRITE: NEGATIVE
PH: 6 (ref 5.0–8.0)
Protein, ur: 30 mg/dL — AB
Specific Gravity, Urine: 1.015 (ref 1.005–1.030)

## 2018-10-31 LAB — HIV ANTIBODY (ROUTINE TESTING W REFLEX): HIV Screen 4th Generation wRfx: NONREACTIVE

## 2018-10-31 LAB — ECHOCARDIOGRAM COMPLETE
HEIGHTINCHES: 72 in
Weight: 3425.6 oz

## 2018-10-31 LAB — TROPONIN I: Troponin I: <0.03 ng/mL (ref <0.03)

## 2018-10-31 MED ORDER — ASPIRIN 81 MG PO CHEW
81.0000 mg | CHEWABLE_TABLET | ORAL | Status: AC
  Administered 2018-11-01: 81 mg via ORAL
  Filled 2018-10-31: qty 1

## 2018-10-31 MED ORDER — ENOXAPARIN SODIUM 40 MG/0.4ML ~~LOC~~ SOLN
40.0000 mg | Freq: Every day | SUBCUTANEOUS | Status: DC
  Administered 2018-10-31 – 2018-11-02 (×2): 40 mg via SUBCUTANEOUS
  Filled 2018-10-31 (×2): qty 0.4

## 2018-10-31 MED ORDER — FINASTERIDE 5 MG PO TABS
5.0000 mg | ORAL_TABLET | Freq: Every day | ORAL | Status: DC
  Administered 2018-10-31 – 2018-11-02 (×3): 5 mg via ORAL
  Filled 2018-10-31 (×3): qty 1

## 2018-10-31 MED ORDER — ATORVASTATIN CALCIUM 40 MG PO TABS
40.0000 mg | ORAL_TABLET | Freq: Every day | ORAL | Status: DC
  Administered 2018-10-31 – 2018-11-01 (×2): 40 mg via ORAL
  Filled 2018-10-31 (×2): qty 1

## 2018-10-31 MED ORDER — ALPRAZOLAM 0.25 MG PO TABS: 0.2500 mg | ORAL_TABLET | Freq: Two times a day (BID) | ORAL | Status: DC | PRN

## 2018-10-31 MED ORDER — SODIUM CHLORIDE 0.9 % WEIGHT BASED INFUSION
1.0000 mL/kg/h | INTRAVENOUS | Status: DC
  Administered 2018-11-01: 1 mL/kg/h via INTRAVENOUS

## 2018-10-31 MED ORDER — SODIUM CHLORIDE 0.9% FLUSH
3.0000 mL | Freq: Two times a day (BID) | INTRAVENOUS | Status: DC
  Administered 2018-10-31: 3 mL via INTRAVENOUS

## 2018-10-31 MED ORDER — SODIUM CHLORIDE 0.9 % IV SOLN: 250.0000 mL | INTRAVENOUS | Status: DC | PRN

## 2018-10-31 MED ORDER — ONDANSETRON HCL 4 MG/2ML IJ SOLN: 4.0000 mg | Freq: Four times a day (QID) | INTRAMUSCULAR | Status: DC | PRN

## 2018-10-31 MED ORDER — SODIUM CHLORIDE 0.9 % WEIGHT BASED INFUSION
3.0000 mL/kg/h | INTRAVENOUS | Status: DC
  Administered 2018-11-01: 3 mL/kg/h via INTRAVENOUS

## 2018-10-31 MED ORDER — ASPIRIN EC 81 MG PO TBEC
324.0000 mg | DELAYED_RELEASE_TABLET | Freq: Every day | ORAL | Status: DC
  Administered 2018-10-31: 324 mg via ORAL
  Filled 2018-10-31 (×2): qty 4

## 2018-10-31 MED ORDER — MORPHINE SULFATE (PF) 2 MG/ML IV SOLN: 2.0000 mg | INTRAVENOUS | Status: DC | PRN

## 2018-10-31 MED ORDER — AMLODIPINE BESYLATE 5 MG PO TABS
5.0000 mg | ORAL_TABLET | Freq: Every day | ORAL | Status: DC
  Administered 2018-10-31 – 2018-11-02 (×3): 5 mg via ORAL
  Filled 2018-10-31 (×3): qty 1

## 2018-10-31 MED ORDER — EZETIMIBE 10 MG PO TABS
10.0000 mg | ORAL_TABLET | Freq: Every day | ORAL | Status: DC
  Administered 2018-10-31 – 2018-11-01 (×2): 10 mg via ORAL
  Filled 2018-10-31 (×2): qty 1

## 2018-10-31 MED ORDER — SODIUM CHLORIDE 0.9% FLUSH: 3.0000 mL | INTRAVENOUS | Status: DC | PRN

## 2018-10-31 MED ORDER — METOPROLOL TARTRATE 50 MG PO TABS
50.0000 mg | ORAL_TABLET | Freq: Once | ORAL | Status: AC
  Administered 2018-10-31: 50 mg via ORAL
  Filled 2018-10-31: qty 1

## 2018-10-31 MED ORDER — ACETAMINOPHEN 325 MG PO TABS: 650.0000 mg | ORAL_TABLET | ORAL | Status: DC | PRN

## 2018-10-31 MED ORDER — EZETIMIBE-SIMVASTATIN 10-80 MG PO TABS: 1.0000 | ORAL_TABLET | Freq: Every day | ORAL | Status: DC

## 2018-10-31 MED ORDER — IOPAMIDOL (ISOVUE-370) INJECTION 76%
80.0000 mL | Freq: Once | INTRAVENOUS | Status: AC | PRN
  Administered 2018-10-31: 80 mL via INTRAVENOUS

## 2018-10-31 MED ORDER — HYDRALAZINE HCL 20 MG/ML IJ SOLN: 10.0000 mg | INTRAMUSCULAR | Status: DC | PRN

## 2018-10-31 NOTE — Progress Notes (Signed)
  Echocardiogram 2D Echocardiogram has been performed.  Matthew Henderson 10/31/2018, 10:53 AM

## 2018-10-31 NOTE — Progress Notes (Addendum)
Progress Note    Matthew DressMichael W Rentfrow  ZOX:[id number] DOB: 06/23/1946  DOA: 10/30/2018 PCP: Clovis RileyMitchell, L.August Saucerean, MD    Brief Narrative:   Chief complaint: Follow-up chest pain.  Medical records reviewed and are as summarized below:  Matthew Henderson is an 72 y.o. male with a PMH of hypertension, hyperlipidemia, BPH who was admitted on 10/30/2018 for evaluation of nonexertional chest pain, central, 3/10 in severity.  Wife took his blood pressure and noted it to be 193/82.  Of note, similar presentation to the ED last month.  He was hypertensive at that time as well, and was started on HCTZ.  Has outpatient cardiology follow-up sometime in December.  In the ED, EKG without ischemic changes.  Blood pressure 179/97.  No acute findings on chest radiography.  Troponin negative.  Assessment/Plan:   Principal Problem:   Chest pain/tachycardia Tachycardic.  Troponins negative x2.  2D echocardiogram showed EF 55-60 with normal wall motion and grade 1 diastolic dysfunction.  Chest x-ray personally reviewed and is negative for acute cardiopulmonary process.  Continue aspirin.  Evaluated by cardiologist who recommended coronary CTA.  Given a dose of metoprolol to address tachycardia.  Active Problems:   Sclerotic changes of lumbar vertebrae Radiologist called me due to sclerotic changes of L4 seen on CT of the coronaries.  This can be seen with lymphoma or metastatic prostate cancer.  I have ordered plain films to further evaluate, but may need a bone scan.    Acute kidney injury Baseline creatinine is 0.9.  Current creatinine 1.36.  For this reason, an ACE inhibitor or an arm was felt to be an appropriate to address his hypertension.    Hypertension Currently being managed with Norvasc.  Metoprolol 50 mg twice daily added by cardiologist.  Blood pressure improved but still suboptimally controlled.    Lightheadedness Improving.    BPH (benign prostatic hyperplasia) Continue Proscar.   Hyperlipidemia Continue Vytorin.  Body mass index is 29.04 kg/m.   Family Communication/Anticipated D/C date and plan/Code Status   DVT prophylaxis: Lovenox ordered. Code Status: Full Code.  Family Communication: Wife updated at bedside. Disposition Plan: Home later today if CTA of the coronaries negative.   Medical Consultants:    Cardiology   Anti-Infectives:    None  Subjective:   Feels well and back to baseline.  No complaints of chest pain or shortness of breath.  Objective:    Vitals:   10/31/18 0100 10/31/18 0125 10/31/18 0127 10/31/18 0628  BP: (!) 167/89 (!) 168/88  (!) 160/87  Pulse: 74 75  73  Resp: 15 20  14   Temp:  97.8 F (36.6 C)  97.9 F (36.6 C)  TempSrc:  Oral  Oral  SpO2: 100% 97%  99%  Weight:   97.1 kg   Height:   6' (1.829 m)    No intake or output data in the 24 hours ending 10/31/18 0748 Filed Weights   10/30/18 2112 10/31/18 0127  Weight: 99.8 kg 97.1 kg    Exam: General: No acute distress. Cardiovascular: Heart sounds show a regular rate, and rhythm. No gallops or rubs. No murmurs. No JVD. Lungs: Clear to auscultation bilaterally with good air movement. No rales, rhonchi or wheezes. Abdomen: Soft, nontender, nondistended with normal active bowel sounds. No masses. No hepatosplenomegaly. Neurological: Alert and oriented 3. Moves all extremities 4 with equal strength. Cranial nerves II through XII grossly intact. Skin: Warm and dry. No rashes or lesions. Extremities: No clubbing or cyanosis. No  edema. Pedal pulses 2+. Psychiatric: Mood and affect are normal. Insight and judgment are normal.   Data Reviewed:   I have personally reviewed following labs and imaging studies:  Labs: Labs show the following:   Basic Metabolic Panel: Recent Labs  Lab 10/30/18 2238  NA 139  K 3.7  CL 106  CO2 28  GLUCOSE 113*  BUN 23  CREATININE 1.36*  CALCIUM 8.9   GFR Estimated Creatinine Clearance: 61 mL/min (A) (by C-G formula  based on SCr of 1.36 mg/dL (H)).  CBC: Recent Labs  Lab 10/30/18 2238  WBC 8.4  HGB 13.2  HCT 41.1  MCV 89.9  PLT 164   Cardiac Enzymes: Recent Labs  Lab 10/31/18 0218 10/31/18 0505  TROPONINI <0.03 <0.03    Microbiology No results found for this or any previous visit (from the past 240 hour(s)).  Procedures and diagnostic studies:  Dg Chest 2 View  Result Date: 10/30/2018 CLINICAL DATA:  Acute onset of chest tightness and generalized weakness. EXAM: CHEST - 2 VIEW COMPARISON:  Chest radiograph performed 10/03/2018 FINDINGS: The lungs are well-aerated and clear. There is no evidence of focal opacification, pleural effusion or pneumothorax. The heart is normal in size; the mediastinal contour is within normal limits. No acute osseous abnormalities are seen. IMPRESSION: No acute cardiopulmonary process seen. Electronically Signed   By: Roanna Raider M.D.   On: 10/30/2018 21:40    Medications:   . amLODipine  5 mg Oral Daily  . aspirin EC  324 mg Oral Daily  . enoxaparin (LOVENOX) injection  40 mg Subcutaneous Daily  . ezetimibe-simvastatin  1 tablet Oral q1800  . finasteride  5 mg Oral Daily   Continuous Infusions:   LOS: 0 days   Hillery Aldo  [facility] Pager [phone]. If unable to reach me by pager, please call my cell phone at [phone].  *Please refer to [email or web address], password [email or web address] to get updated schedule on who will round on this patient, as hospitalists switch teams weekly. If 7PM-7AM, please contact night-coverage at [email or web address], password [email or web address] for any overnight needs.  10/31/2018, 7:48 AM

## 2018-10-31 NOTE — H&P (View-Only) (Signed)
Discussed with the patient regarding coronary CT and echo report. Noted to have positive FFR in distal RCA. Discussed with Dr. Delton SeeNelson, plan for cardiac catheterization tomorrow.   Risk and benefit of procedure explained to the patient who display clear understanding and agree to proceed.  Discussed with patient possible procedural risk include bleeding, vascular injury, renal injury, arrythmia, MI, stroke and loss of limb or life.   Ramond DialSigned, Shaylin Blatt PA Pager: [phone]

## 2018-10-31 NOTE — Consult Note (Signed)
Cardiology Consultation:   Patient ID: Matthew DressMichael W Reising MRN: [id number]; DOB: 09/16/1946  Admit date: 10/30/2018 Date of Consult: 10/31/2018  Primary Care Provider: Clovis RileyMitchell, L.August Saucerean, MD Primary Cardiologist:   Patient Profile:   Matthew Henderson is a 72 y.o. male with a hx of hypertension, hyperlipidemia who is being seen today for the evaluation of  Chest pain at the request of Dr Therisa Doyneoutova, Anastassia.  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  He has been having these chest pains in the last few months, they are not associated with exertion.  Not related to food intake.  He has noticed however that he gets slightly more tired with activity and while playing golf and walking around he gets lightheaded.  This resolves at rest.  On some occasions when he gets chest pain he gets clammy and he feels numbness in his hands. His younger brother recently received cardiac stents.  He has never been a smoker.  He has been treated for hypertension and hyperlipidemia by his primary care physician.  Past Medical History:  Diagnosis Date  . Hypercholesteremia   . Hypertension   . Prostate enlargement    Past Surgical History:  Procedure Laterality Date  . HERNIA REPAIR     OVER 20 YEARS AGO    Inpatient Medications: Scheduled Meds: . amLODipine  5 mg Oral Daily  . aspirin EC  324 mg Oral Daily  . enoxaparin (LOVENOX) injection  40 mg Subcutaneous Daily  . ezetimibe-simvastatin  1 tablet Oral q1800  . finasteride  5 mg Oral Daily   Continuous Infusions:  PRN Meds: acetaminophen, ALPRAZolam, hydrALAZINE, morphine injection, ondansetron (ZOFRAN) IV  Allergies:   No Known Allergies  Social  History:   Social History   Socioeconomic History  . Marital status: Married    Spouse name: Not on file  . Number of children: Not on file  . Years of education: Not on file  . Highest education level: Not on file  Occupational History  . Not on file  Social Needs  . Financial resource strain: Not on file  . Food insecurity:    Worry: Not on file    Inability: Not on file  . Transportation needs:    Medical: Not on file    Non-medical: Not on file  Tobacco Use  . Smoking status: Never Smoker  . Smokeless tobacco: Never Used  Substance and Sexual Activity  . Alcohol use: No  . Drug use: No  . Sexual activity: Not on file  Lifestyle  . Physical activity:    Days per week: Not on file    Minutes per session: Not on file  . Stress: Not on file  Relationships  . Social connections:    Talks on phone: Not on file    Gets together: Not on file    Attends religious service: Not on file    Active member of club or organization: Not on file    Attends meetings of clubs or organizations: Not on file    Relationship status: Not on file  . Intimate partner violence:    Fear of current or ex partner: Not on file  Emotionally abused: Not on file    Physically abused: Not on file    Forced sexual activity: Not on file  Other Topics Concern  . Not on file  Social History Narrative  . Not on file    Family History:    Family History  Problem Relation Age of Onset  . Cancer Mother        BREAST  . Heart attack Father   . Heart disease Brother        STENTS  . Other Maternal Grandmother        OLD AGE    ROS:  Please see the history of present illness.  All other ROS reviewed and negative.     Physical Exam/Data:   Vitals:   10/31/18 0125 10/31/18 0127 10/31/18 0628 10/31/18 0920  BP: (!) 168/88  (!) 160/87 (!) 143/83  Pulse: 75  73 70  Resp: 20  14 16   Temp: 97.8 F (36.6 C)  97.9 F (36.6 C)   TempSrc: Oral  Oral   SpO2: 97%  99% 98%  Weight:  97.1 kg      Height:  6' (1.829 m)     No intake or output data in the 24 hours ending 10/31/18 1009 Filed Weights   10/30/18 2112 10/31/18 0127  Weight: 99.8 kg 97.1 kg   Body mass index is 29.04 kg/m.  General:  Well nourished, well developed, in no acute distress HEENT: normal Lymph: no adenopathy Neck: no JVD Endocrine:  No thryomegaly Vascular: No carotid bruits; FA pulses 2+ bilaterally without bruits  Cardiac:  normal S1, S2; RRR; no murmur  Lungs:  clear to auscultation bilaterally, no wheezing, rhonchi or rales  Abd: soft, nontender, no hepatomegaly  Ext: no edema Musculoskeletal:  No deformities, BUE and BLE strength normal and equal Skin: warm and dry  Neuro:  CNs 2-12 intact, no focal abnormalities noted Psych:  Normal affect   EKG:  The EKG was personally reviewed and demonstrates:  SR, PAC Telemetry:  Telemetry was personally reviewed and demonstrates:  SR  Relevant CV Studies:  Laboratory Data:  Chemistry Recent Labs  Lab 10/30/18 2238  NA 139  K 3.7  CL 106  CO2 28  GLUCOSE 113*  BUN 23  CREATININE 1.36*  CALCIUM 8.9  GFRNONAA 51*  GFRAA 59*  ANIONGAP 5    No results for input(s): PROT, ALBUMIN, AST, ALT, ALKPHOS, BILITOT in the last 168 hours. Hematology Recent Labs  Lab 10/30/18 2238  WBC 8.4  RBC 4.57  HGB 13.2  HCT 41.1  MCV 89.9  MCH 28.9  MCHC 32.1  RDW 13.9  PLT 164   Cardiac Enzymes Recent Labs  Lab 10/31/18 0218 10/31/18 0505 10/31/18 0737  TROPONINI <0.03 <0.03 <0.03    Recent Labs  Lab 10/30/18 2246  TROPIPOC 0.02    BNPNo results for input(s): BNP, PROBNP in the last 168 hours.  DDimer No results for input(s): DDIMER in the last 168 hours.  Radiology/Studies:  Dg Chest 2 View  Result Date: 10/30/2018 CLINICAL DATA:  Acute onset of chest tightness and generalized weakness. EXAM: CHEST - 2 VIEW COMPARISON:  Chest radiograph performed 10/03/2018 FINDINGS: The lungs are well-aerated and clear. There is no evidence of focal  opacification, pleural effusion or pneumothorax. The heart is normal in size; the mediastinal contour is within normal limits. No acute osseous abnormalities are seen. IMPRESSION: No acute cardiopulmonary process seen. Electronically Signed   By: Roanna Raider M.D.   On:  10/30/2018 21:40    Assessment and Plan:   1. Chest pain, with some typical and some atypical features, risk factors include hypertension hyperlipidemia and family history of coronary artery disease. 2. I will arrange for coronary CTA ideally be done today.  Patient's baseline heart rate is in 70s, I will give him metoprolol 50 mg p.o. X1. 3. Continue medical management for hyperlipidemia, I will adjust dose based on CT results. Patient's blood pressure is elevated, I will start metoprolol 50 mg p.o. twice daily.  Today his creatinine is slightly elevated, so I will not start ACE or ARB, that would be preferred medication in the future.  For questions or updates, please contact [facility] Please consult [email or web address] for contact info under   Signed, Tobias Alexander, MD  10/31/2018 10:09 AM

## 2018-10-31 NOTE — ED Notes (Signed)
ED TO INPATIENT HANDOFF REPORT  Name/Age/Gender Matthew Henderson 72 y.o. male  Code Status   Home/SNF/Other Home  Chief Complaint Chest Pain / Elevated Blood Pressure 193/82  Level of Care/Admitting Diagnosis ED Disposition    ED Disposition Condition Comment   Admit  Hospital Area: [place] [[id number]]  Level of Care: Telemetry [5]  Admit to tele based on following criteria: Monitor for Ischemic changes  Diagnosis: Chest pain [557322]  Admitting Physician: Toy Baker [[id number]]  Attending Physician: Toy Baker [[id number]]  PT Class (Do Not Modify): Observation [104]  PT Acc Code (Do Not Modify): Observation [10022]       Medical History Past Medical History:  Diagnosis Date  . Hypercholesteremia   . Hypertension   . Prostate enlargement     Allergies No Known Allergies  IV Location/Drains/Wounds Patient Lines/Drains/Airways Status   Active Line/Drains/Airways    Name:   Placement date:   Placement time:   Site:   Days:   Peripheral IV 10/31/18 Left Wrist   10/31/18    0027    Wrist   less than 1          Labs/Imaging Results for orders placed or performed during the hospital encounter of 10/30/18 (from the past 48 hour(s))  Basic metabolic panel     Status: Abnormal   Collection Time: 10/30/18 10:38 PM  Result Value Ref Range   Sodium 139 135 - 145 mmol/L   Potassium 3.7 3.5 - 5.1 mmol/L   Chloride 106 98 - 111 mmol/L   CO2 28 22 - 32 mmol/L   Glucose, Bld 113 (H) 70 - 99 mg/dL   BUN 23 8 - 23 mg/dL   Creatinine, Ser 1.36 (H) 0.61 - 1.24 mg/dL   Calcium 8.9 8.9 - 10.3 mg/dL   GFR calc non Af Amer 51 (L) >60 mL/min   GFR calc Af Amer 59 (L) >60 mL/min    Comment: (NOTE) The eGFR has been calculated using the CKD EPI equation. This calculation has not been validated in all clinical situations. eGFR's persistently <60 mL/min signify possible Chronic Kidney Disease.    Anion gap 5 5 - 15    Comment: Performed at [facility], [place] [address] [place], [place] [address]  CBC     Status: None   Collection Time: 10/30/18 10:38 PM  Result Value Ref Range   WBC 8.4 4.0 - 10.5 K/uL   RBC 4.57 4.22 - 5.81 MIL/uL   Hemoglobin 13.2 13.0 - 17.0 g/dL   HCT 41.1 39.0 - 52.0 %   MCV 89.9 80.0 - 100.0 fL   MCH 28.9 26.0 - 34.0 pg   MCHC 32.1 30.0 - 36.0 g/dL   RDW 13.9 11.5 - 15.5 %   Platelets 164 150 - 400 K/uL   nRBC 0.0 0.0 - 0.2 %    Comment: Performed at [facility], [place] [address], [place], [place] [address]  I-stat troponin, ED     Status: None   Collection Time: 10/30/18 10:46 PM  Result Value Ref Range   Troponin i, poc 0.02 0.00 - 0.08 ng/mL   Comment 3            Comment: Due to the release kinetics of cTnI, a negative result within the first hours of the onset of symptoms does not rule out myocardial infarction with certainty. If myocardial infarction is still suspected, repeat the test at appropriate intervals.    Dg Chest  2 View  Result Date: 10/30/2018 CLINICAL DATA:  Acute onset of chest tightness and generalized weakness. EXAM: CHEST - 2 VIEW COMPARISON:  Chest radiograph performed 10/03/2018 FINDINGS: The lungs are well-aerated and clear. There is no evidence of focal opacification, pleural effusion or pneumothorax. The heart is normal in size; the mediastinal contour is within normal limits. No acute osseous abnormalities are seen. IMPRESSION: No acute cardiopulmonary process seen. Electronically Signed   By: Garald Balding M.D.   On: 10/30/2018 21:40   EKG Interpretation  Date/Time:  Saturday October 30 2018 21:09:31 EST Ventricular Rate:  83 PR Interval:    QRS Duration: 96 QT Interval:  390 QTC Calculation: 459 R Axis:   12 Text Interpretation:  Sinus rhythm Ventricular premature complex Baseline wander in lead(s) V6 Confirmed by Gerlene Fee [phone]) on 10/30/2018 10:11:52 PM   Pending Labs Unresulted Labs (From admission, onward)     Start     Ordered   10/31/18 0038  Urinalysis, Routine w reflex microscopic  Once,   R     10/31/18 0037   Signed and Held  HIV antibody (Routine Testing)  Once,   R     Signed and Held   Signed and Held  Troponin I - Now Then Q3H  Now then every 3 hours,   TIMED     Signed and Held          Vitals/Pain Today's Vitals   10/30/18 2300 10/30/18 2330 10/30/18 2337 10/31/18 0100  BP: (!) 144/85 (!) 152/82 (!) 152/82 (!) 167/89  Pulse: 66 72 67 74  Resp: 14 (!) '21 17 15  ' Temp:      TempSrc:      SpO2: 100% 100% 100% 100%  Weight:      Height:      PainSc:        Isolation Precautions No active isolations  Medications Medications - No data to display  Mobility walks

## 2018-10-31 NOTE — Plan of Care (Signed)
Patient without chest pain this shift.  Verbalizes understanding that he is nothing by mouth after midnight for cardiac cath 11/01/18.  Consent form on chart.  Patient watched precath video.

## 2018-10-31 NOTE — Progress Notes (Signed)
Discussed with the patient regarding coronary CT and echo report. Noted to have positive FFR in distal RCA. Discussed with Dr. Nelson, plan for cardiac catheterization tomorrow.   Risk and benefit of procedure explained to the patient who display clear understanding and agree to proceed.  Discussed with patient possible procedural risk include bleeding, vascular injury, renal injury, arrythmia, MI, stroke and loss of limb or life.   Signed, Kayen Grabel PA Pager: [phone]  

## 2018-10-31 NOTE — Plan of Care (Signed)
Patient has had no recurrence of chest pain since admission.  Tolerating heart healthy diet, no nausea or shortness of breath.  Wife and multiple family members at bedside.  Pending further workup by cardiology.

## 2018-11-01 ENCOUNTER — Inpatient Hospital Stay (HOSPITAL_COMMUNITY)
Admission: EM | Disposition: A | Discharge status: Home / Self Care | Payer: Self-pay | Source: Home / Self Care | Attending: Interventional Cardiology

## 2018-11-01 DIAGNOSIS — I25110 Atherosclerotic heart disease of native coronary artery with unstable angina pectoris: Secondary | ICD-10-CM | POA: Diagnosis present

## 2018-11-01 DIAGNOSIS — R079 Chest pain, unspecified: Secondary | ICD-10-CM

## 2018-11-01 DIAGNOSIS — Z8249 Family history of ischemic heart disease and other diseases of the circulatory system: Secondary | ICD-10-CM | POA: Diagnosis not present

## 2018-11-01 DIAGNOSIS — Z79899 Other long term (current) drug therapy: Secondary | ICD-10-CM | POA: Diagnosis not present

## 2018-11-01 DIAGNOSIS — N400 Enlarged prostate without lower urinary tract symptoms: Secondary | ICD-10-CM | POA: Diagnosis present

## 2018-11-01 DIAGNOSIS — I200 Unstable angina: Secondary | ICD-10-CM | POA: Diagnosis not present

## 2018-11-01 DIAGNOSIS — I2510 Atherosclerotic heart disease of native coronary artery without angina pectoris: Secondary | ICD-10-CM | POA: Diagnosis not present

## 2018-11-01 DIAGNOSIS — Z809 Family history of malignant neoplasm, unspecified: Secondary | ICD-10-CM | POA: Diagnosis not present

## 2018-11-01 DIAGNOSIS — N182 Chronic kidney disease, stage 2 (mild): Secondary | ICD-10-CM | POA: Diagnosis present

## 2018-11-01 DIAGNOSIS — E785 Hyperlipidemia, unspecified: Secondary | ICD-10-CM | POA: Diagnosis present

## 2018-11-01 DIAGNOSIS — I129 Hypertensive chronic kidney disease with stage 1 through stage 4 chronic kidney disease, or unspecified chronic kidney disease: Secondary | ICD-10-CM | POA: Diagnosis present

## 2018-11-01 DIAGNOSIS — R937 Abnormal findings on diagnostic imaging of other parts of musculoskeletal system: Secondary | ICD-10-CM

## 2018-11-01 DIAGNOSIS — R001 Bradycardia, unspecified: Secondary | ICD-10-CM | POA: Diagnosis not present

## 2018-11-01 DIAGNOSIS — N179 Acute kidney failure, unspecified: Secondary | ICD-10-CM

## 2018-11-01 DIAGNOSIS — I10 Essential (primary) hypertension: Secondary | ICD-10-CM | POA: Diagnosis not present

## 2018-11-01 DIAGNOSIS — Z7982 Long term (current) use of aspirin: Secondary | ICD-10-CM | POA: Diagnosis not present

## 2018-11-01 HISTORY — PX: LEFT HEART CATH AND CORONARY ANGIOGRAPHY: CATH118249

## 2018-11-01 HISTORY — PX: CORONARY STENT INTERVENTION: CATH118234

## 2018-11-01 LAB — BASIC METABOLIC PANEL
ANION GAP: 7 (ref 5–15)
BUN: 17 mg/dL (ref 8–23)
CALCIUM: 8.5 mg/dL — AB (ref 8.9–10.3)
CO2: 25 mmol/L (ref 22–32)
Chloride: 107 mmol/L (ref 98–111)
Creatinine, Ser: 1.16 mg/dL (ref 0.61–1.24)
Glucose, Bld: 104 mg/dL — ABNORMAL HIGH (ref 70–99)
POTASSIUM: 3.7 mmol/L (ref 3.5–5.1)
SODIUM: 139 mmol/L (ref 135–145)

## 2018-11-01 LAB — POCT ACTIVATED CLOTTING TIME
Activated Clotting Time: 362 seconds
Activated Clotting Time: 400 seconds

## 2018-11-01 SURGERY — LEFT HEART CATH AND CORONARY ANGIOGRAPHY
Anesthesia: LOCAL

## 2018-11-01 MED ORDER — LIDOCAINE HCL (PF) 1 % IJ SOLN
INTRAMUSCULAR | Status: AC
  Filled 2018-11-01: qty 30

## 2018-11-01 MED ORDER — SODIUM CHLORIDE 0.9% FLUSH
3.0000 mL | Freq: Two times a day (BID) | INTRAVENOUS | Status: DC
  Administered 2018-11-01: 3 mL via INTRAVENOUS

## 2018-11-01 MED ORDER — ONDANSETRON HCL 4 MG/2ML IJ SOLN
INTRAMUSCULAR | Status: AC
  Filled 2018-11-01: qty 2

## 2018-11-01 MED ORDER — ONDANSETRON HCL 4 MG/2ML IJ SOLN: 4.0000 mg | Freq: Four times a day (QID) | INTRAMUSCULAR | Status: DC | PRN

## 2018-11-01 MED ORDER — HEPARIN SODIUM (PORCINE) 1000 UNIT/ML IJ SOLN
INTRAMUSCULAR | Status: AC
  Filled 2018-11-01: qty 1

## 2018-11-01 MED ORDER — MIDAZOLAM HCL 2 MG/2ML IJ SOLN
INTRAMUSCULAR | Status: AC
  Filled 2018-11-01: qty 2

## 2018-11-01 MED ORDER — VERAPAMIL HCL 2.5 MG/ML IV SOLN
INTRAVENOUS | Status: DC | PRN
  Administered 2018-11-01: 10 mL via INTRA_ARTERIAL

## 2018-11-01 MED ORDER — CLOPIDOGREL BISULFATE 75 MG PO TABS
75.0000 mg | ORAL_TABLET | Freq: Every day | ORAL | Status: DC
  Administered 2018-11-02: 08:00:00 75 mg via ORAL
  Filled 2018-11-01: qty 1

## 2018-11-01 MED ORDER — CLOPIDOGREL BISULFATE 300 MG PO TABS
ORAL_TABLET | ORAL | Status: AC
  Filled 2018-11-01: qty 1

## 2018-11-01 MED ORDER — SODIUM CHLORIDE 0.9% FLUSH: 3.0000 mL | INTRAVENOUS | Status: DC | PRN

## 2018-11-01 MED ORDER — SODIUM CHLORIDE 0.9 % IV SOLN: 250.0000 mL | INTRAVENOUS | Status: DC | PRN

## 2018-11-01 MED ORDER — SODIUM CHLORIDE 0.9 % IV SOLN
INTRAVENOUS | Status: AC
  Administered 2018-11-01: 16:00:00 via INTRAVENOUS

## 2018-11-01 MED ORDER — FENTANYL CITRATE (PF) 100 MCG/2ML IJ SOLN
INTRAMUSCULAR | Status: AC
  Filled 2018-11-01: qty 2

## 2018-11-01 MED ORDER — IOHEXOL 350 MG/ML SOLN
INTRAVENOUS | Status: DC | PRN
  Administered 2018-11-01: 120 mL via INTRAVENOUS

## 2018-11-01 MED ORDER — ANGIOPLASTY BOOK
Freq: Once | Status: AC
  Administered 2018-11-01: 1
  Filled 2018-11-01: qty 1

## 2018-11-01 MED ORDER — ACETAMINOPHEN 325 MG PO TABS: 650.0000 mg | ORAL_TABLET | ORAL | Status: DC | PRN

## 2018-11-01 MED ORDER — ONDANSETRON HCL 4 MG/2ML IJ SOLN
INTRAMUSCULAR | Status: DC | PRN
  Administered 2018-11-01: 4 mg via INTRAVENOUS

## 2018-11-01 MED ORDER — HEPARIN SODIUM (PORCINE) 1000 UNIT/ML IJ SOLN
INTRAMUSCULAR | Status: DC | PRN
  Administered 2018-11-01: 5000 [IU] via INTRAVENOUS
  Administered 2018-11-01: 6000 [IU] via INTRAVENOUS

## 2018-11-01 MED ORDER — HEPARIN (PORCINE) IN NACL 1000-0.9 UT/500ML-% IV SOLN
INTRAVENOUS | Status: AC
  Filled 2018-11-01: qty 1000

## 2018-11-01 MED ORDER — ASPIRIN 81 MG PO CHEW: 81.0000 mg | CHEWABLE_TABLET | Freq: Every day | ORAL | Status: DC

## 2018-11-01 MED ORDER — FENTANYL CITRATE (PF) 100 MCG/2ML IJ SOLN
INTRAMUSCULAR | Status: DC | PRN
  Administered 2018-11-01 (×3): 25 ug via INTRAVENOUS

## 2018-11-01 MED ORDER — LABETALOL HCL 5 MG/ML IV SOLN
10.0000 mg | INTRAVENOUS | Status: AC | PRN
  Administered 2018-11-01: 19:00:00 10 mg via INTRAVENOUS
  Filled 2018-11-01: qty 4

## 2018-11-01 MED ORDER — LIDOCAINE HCL (PF) 1 % IJ SOLN
INTRAMUSCULAR | Status: DC | PRN
  Administered 2018-11-01: 2 mL via INTRADERMAL

## 2018-11-01 MED ORDER — VERAPAMIL HCL 2.5 MG/ML IV SOLN
INTRAVENOUS | Status: AC
  Filled 2018-11-01: qty 2

## 2018-11-01 MED ORDER — MIDAZOLAM HCL 2 MG/2ML IJ SOLN
INTRAMUSCULAR | Status: DC | PRN
  Administered 2018-11-01: 1 mg via INTRAVENOUS
  Administered 2018-11-01: 2 mg via INTRAVENOUS
  Administered 2018-11-01: 1 mg via INTRAVENOUS

## 2018-11-01 MED ORDER — TIROFIBAN HCL IN NACL 5-0.9 MG/100ML-% IV SOLN
INTRAVENOUS | Status: AC
  Filled 2018-11-01: qty 100

## 2018-11-01 MED ORDER — ASPIRIN EC 81 MG PO TBEC
81.0000 mg | DELAYED_RELEASE_TABLET | Freq: Every day | ORAL | Status: DC
  Administered 2018-11-02: 81 mg via ORAL
  Filled 2018-11-01: qty 1

## 2018-11-01 MED ORDER — HEPARIN (PORCINE) IN NACL 1000-0.9 UT/500ML-% IV SOLN
INTRAVENOUS | Status: DC | PRN
  Administered 2018-11-01 (×2): 500 mL

## 2018-11-01 MED ORDER — TIROFIBAN (AGGRASTAT) BOLUS VIA INFUSION
INTRAVENOUS | Status: DC | PRN
  Administered 2018-11-01: 2427.5 ug via INTRAVENOUS

## 2018-11-01 MED ORDER — NITROGLYCERIN 1 MG/10 ML FOR IR/CATH LAB
INTRA_ARTERIAL | Status: AC
  Filled 2018-11-01: qty 10

## 2018-11-01 MED ORDER — HYDRALAZINE HCL 20 MG/ML IJ SOLN: 5.0000 mg | INTRAMUSCULAR | Status: AC | PRN

## 2018-11-01 MED ORDER — CLOPIDOGREL BISULFATE 300 MG PO TABS
ORAL_TABLET | ORAL | Status: DC | PRN
  Administered 2018-11-01: 600 mg via ORAL

## 2018-11-01 MED ORDER — NITROGLYCERIN 1 MG/10 ML FOR IR/CATH LAB
INTRA_ARTERIAL | Status: DC | PRN
  Administered 2018-11-01: 200 ug via INTRACORONARY
  Administered 2018-11-01: 400 ug via INTRA_ARTERIAL

## 2018-11-01 SURGICAL SUPPLY — 20 items
BALLN EMERGE MR 2.5X12 (BALLOONS) ×2
BALLN SAPPHIRE ~~LOC~~ 3.0X15 (BALLOONS) ×2 IMPLANT
BALLN SAPPHIRE ~~LOC~~ 3.75X18 (BALLOONS) ×2 IMPLANT
BALLOON EMERGE MR 2.5X12 (BALLOONS) ×1 IMPLANT
CATH 5FR JL3.5 JR4 ANG PIG MP (CATHETERS) ×2 IMPLANT
CATH VISTA GUIDE 6FR XBRCA (CATHETERS) ×2 IMPLANT
DEVICE RAD COMP TR BAND LRG (VASCULAR PRODUCTS) ×2 IMPLANT
GLIDESHEATH SLEND SS 6F .021 (SHEATH) ×2 IMPLANT
GUIDEWIRE INQWIRE 1.5J.035X260 (WIRE) ×1 IMPLANT
INQWIRE 1.5J .035X260CM (WIRE) ×2
KIT ENCORE 26 ADVANTAGE (KITS) ×2 IMPLANT
KIT HEART LEFT (KITS) ×2 IMPLANT
KIT HEMO VALVE WATCHDOG (MISCELLANEOUS) ×2 IMPLANT
PACK CARDIAC CATHETERIZATION (CUSTOM PROCEDURE TRAY) ×2 IMPLANT
STENT SYNERGY DES 2.5X20 (Permanent Stent) ×2 IMPLANT
STENT SYNERGY DES 3X24 (Permanent Stent) ×2 IMPLANT
TRANSDUCER W/STOPCOCK (MISCELLANEOUS) ×2 IMPLANT
TUBING CIL FLEX 10 FLL-RA (TUBING) ×2 IMPLANT
WIRE MINAMO 190 (WIRE) ×2 IMPLANT
WIRE SION BLUE 180 (WIRE) ×4 IMPLANT

## 2018-11-01 NOTE — Interval H&P Note (Signed)
Cath Lab Visit (complete for each Cath Lab visit)  Clinical Evaluation Leading to the Procedure:   ACS: Yes.    Non-ACS:    Anginal Classification: CCS IV  Anti-ischemic medical therapy: Minimal Therapy (1 class of medications)  Non-Invasive Test Results: Intermediate-risk stress test findings: cardiac mortality 1-3%/year  Prior CABG: No previous CABG   Abnormal coronary CT.   History and Physical Interval Note:  11/01/2018 12:35 PM  Matthew DressMichael W Henderson  has presented today for surgery, with the diagnosis of unstable angina  The various methods of treatment have been discussed with the patient and family. After consideration of risks, benefits and other options for treatment, the patient has consented to  Procedure(s): LEFT HEART CATH AND CORONARY ANGIOGRAPHY (N/A) as a surgical intervention .  The patient's history has been reviewed, patient examined, no change in status, stable for surgery.  I have reviewed the patient's chart and labs.  Questions were answered to the patient's satisfaction.     Lance MussJayadeep Caroll Weinheimer

## 2018-11-01 NOTE — Progress Notes (Addendum)
Progress Note  Patient Name: Matthew Henderson Date of Encounter: 11/01/2018  Primary Cardiologist: Tobias Alexander, MD   Subjective   Currently CP free. Family present at bedside.   Inpatient Medications    Scheduled Meds: . amLODipine  5 mg Oral Daily  . ezetimibe  10 mg Oral q1800   And  . atorvastatin  40 mg Oral q1800  . enoxaparin (LOVENOX) injection  40 mg Subcutaneous Daily  . finasteride  5 mg Oral Daily  . sodium chloride flush  3 mL Intravenous Q12H   Continuous Infusions: . sodium chloride    . sodium chloride 1 mL/kg/hr (11/01/18 0503)   PRN Meds: sodium chloride, acetaminophen, ALPRAZolam, hydrALAZINE, morphine injection, ondansetron (ZOFRAN) IV, sodium chloride flush   Vital Signs    Vitals:   10/31/18 1350 10/31/18 2156 11/01/18 0441 11/01/18 1022  BP: 132/69 (!) 146/73 (!) 143/75 (!) 145/72  Pulse: 68 65 69   Resp: (!) 28 16 18    Temp: 98.8 F (37.1 C) 98 F (36.7 C) 98.7 F (37.1 C)   TempSrc: Oral Oral Oral   SpO2: 96% 98% 96%   Weight:      Height:        Intake/Output Summary (Last 24 hours) at 11/01/2018 1028 Last data filed at 11/01/2018 0600 Gross per 24 hour  Intake 382 ml  Output -  Net 382 ml   Filed Weights   10/30/18 2112 10/31/18 0127  Weight: 99.8 kg 97.1 kg    Telemetry    NSR 60s-70s - Personally Reviewed  ECG    NSR 67 bpm - Personally Reviewed  Physical Exam   GEN: No acute distress.   Neck: No JVD Cardiac: RRR, no murmurs, rubs, or gallops.  Respiratory: Clear to auscultation bilaterally. GI: Soft, nontender, non-distended  MS: No edema; No deformity. Neuro:  Nonfocal  Psych: Normal affect   Labs    Chemistry Recent Labs  Lab 10/30/18 2238 11/01/18 0755  NA 139 139  K 3.7 3.7  CL 106 107  CO2 28 25  GLUCOSE 113* 104*  BUN 23 17  CREATININE 1.36* 1.16  CALCIUM 8.9 8.5*  GFRNONAA 51* >60  GFRAA 59* >60  ANIONGAP 5 7     Hematology Recent Labs  Lab 10/30/18 2238  WBC 8.4  RBC 4.57   HGB 13.2  HCT 41.1  MCV 89.9  MCH 28.9  MCHC 32.1  RDW 13.9  PLT 164    Cardiac Enzymes Recent Labs  Lab 10/31/18 0218 10/31/18 0505 10/31/18 0737  TROPONINI <0.03 <0.03 <0.03    Recent Labs  Lab 10/30/18 2246  TROPIPOC 0.02     BNPNo results for input(s): BNP, PROBNP in the last 168 hours.   DDimer No results for input(s): DDIMER in the last 168 hours.   Radiology    Dg Chest 2 View  Addendum Date: 10/31/2018   ADDENDUM REPORT: 10/31/2018 12:48 CLINICAL DATA:  72 year old male with hypertension, hyperlipidemia and chest pain. EXAM: Cardiac/Coronary  CT TECHNIQUE: The patient was scanned on a [employer]. FINDINGS: A 120 kV prospective scan was triggered in the descending thoracic aorta at 111 HU's. Axial non-contrast 3 mm slices were carried out through the heart. The data set was analyzed on a dedicated work station and scored using the Agatson method. Gantry rotation speed was 250 msecs and collimation was .6 mm. No beta blockade and 0.8 mg of sl NTG was given. The 3D data set was reconstructed in 5% intervals of  the 67-82 % of the R-R cycle. Diastolic phases were analyzed on a dedicated work station using MPR, MIP and VRT modes. The patient received 80 cc of contrast. Aorta: Normal size. No calcifications, mild diffuse atheroma in the descending aorta. No dissection. Aortic Valve:  Trileaflet.  Trivial calcifications. Coronary Arteries:  Normal coronary origin.  Right dominance. RCA is a large dominant artery that gives rise to PDA and PLVB. There is severe predominantly non-calcified plaque in the proximal RCA with high risk features (napkin ring sign) suspicious for stenosis > 70%. Mid RCA has diffuse mixed, predominantly calcified plaque with a focal stenosis 50-69%. Distal RCA/PDA/PLA have only minimal plaque. Left main is a large and long artery that gives rise to LAD and LCX arteries. Distal left main has stenosis 0-25%. LAD is a large vessel that gives rise to  two diagonal arteries and wraps around the apex. Proximal to mid LAD has moderate mixed plaque with diffuse stenosis 50-69% and suspicion for a focal stenosis >70% at the take off of second diagonal artery. Mid to distal LAD have only minimal plaque. D1,2 are fairly small and have diffuse plaque with maximum stenosis 25-50% at the proximal portions. LCX is a large non-dominant artery that gives rise to one large OM1 branch. There is mild calcified plaque in the proximal LCX artery with stenosis 25-50%. OM1 is a large branch with no significant stenosis. Other findings: Normal pulmonary vein drainage into the left atrium. Normal let atrial appendage without a thrombus. IMPRESSION: 1. Coronary calcium score of 288. This was 50 percentile for age and sex matched control. 2. Normal coronary origin with right dominance. 3. Diffuse 2 vessel disease with > 70 stenosis in the proximal RCA and moderate diffuse stenosis in the proximal to mid LAD. Additional analysis with CT FFR will be submitted. 4.  Pulmonary artery is dilated measuring 33 mm. Electronically Signed   By: Tobias Alexander   On: 10/31/2018 12:48   Result Date: 10/31/2018 CLINICAL DATA:  Acute onset of chest tightness and generalized weakness. EXAM: CHEST - 2 VIEW COMPARISON:  Chest radiograph performed 10/03/2018 FINDINGS: The lungs are well-aerated and clear. There is no evidence of focal opacification, pleural effusion or pneumothorax. The heart is normal in size; the mediastinal contour is within normal limits. No acute osseous abnormalities are seen. IMPRESSION: No acute cardiopulmonary process seen. Electronically Signed: By: Roanna Raider M.D. On: 10/30/2018 21:40   Dg Lumbar Spine 2-3 Views  Result Date: 10/31/2018 CLINICAL DATA:  L1 sclerosis. EXAM: LUMBAR SPINE - 2-3 VIEW COMPARISON:  CT abdomen pelvis dated July 18, 2014. FINDINGS: Five lumbar type vertebral bodies. No acute fracture or subluxation. Vertebral body heights are preserved.  Alignment is normal. Mild-to-moderate multilevel disc height loss throughout the lumbar spine, worst at L4-L5, similar to prior studies. Unchanged diffuse sclerosis of the L1 vertebral body with slightly coarsened trabeculae. Excreted contrast within the renal collecting systems, ureters, and bladder. IMPRESSION: Unchanged diffuse sclerosis of the L1 vertebral body, stable since 2015. This favors a benign process such as Paget's disease or atypical hemangioma, less likely an indolent neoplastic process such as lymphoma. Consider further evaluation with MRI of the lumbar spine with and without contrast. Electronically Signed   By: Obie Dredge M.D.   On: 10/31/2018 13:41   Ct Coronary Morph W/cta Cor W/score W/ca W/cm &/or Wo/cm  Addendum Date: 10/31/2018   ADDENDUM REPORT: 10/31/2018 16:23 ADDENDUM: Correction: There are two tandem lesions in the distal RCA - the first one  is severe noncalcified with associated stenosis suspicious > 70% stenosis, the second one moderate non-calcified with stenosis 50-69%. Electronically Signed   By: Tobias Alexander   On: 10/31/2018 16:23   Addendum Date: 10/31/2018   ADDENDUM REPORT: 10/31/2018 13:01 CLINICAL DATA:  72 year old male with hypertension, hyperlipidemia and chest pain. EXAM: Cardiac/Coronary  CT TECHNIQUE: The patient was scanned on a [employer]. FINDINGS: A 120 kV prospective scan was triggered in the descending thoracic aorta at 111 HU's. Axial non-contrast 3 mm slices were carried out through the heart. The data set was analyzed on a dedicated work station and scored using the Agatson method. Gantry rotation speed was 250 msecs and collimation was .6 mm. No beta blockade and 0.8 mg of sl NTG was given. The 3D data set was reconstructed in 5% intervals of the 67-82 % of the R-R cycle. Diastolic phases were analyzed on a dedicated work station using MPR, MIP and VRT modes. The patient received 80 cc of contrast. Aorta: Normal size. No  calcifications, mild diffuse atheroma in the descending aorta. No dissection. Aortic Valve:  Trileaflet.  Trivial calcifications. Coronary Arteries:  Normal coronary origin.  Right dominance. RCA is a large dominant artery that gives rise to PDA and PLVB. There is severe predominantly non-calcified plaque in the proximal RCA with high risk features (napkin ring sign) suspicious for stenosis > 70%. Mid RCA has diffuse mixed, predominantly calcified plaque with a focal stenosis 50-69%. Distal RCA/PDA/PLA have only minimal plaque. Left main is a large and long artery that gives rise to LAD and LCX arteries. Distal left main has stenosis 0-25%. LAD is a large vessel that gives rise to two diagonal arteries and wraps around the apex. Proximal to mid LAD has moderate mixed plaque with diffuse stenosis 50-69% and suspicion for a focal stenosis >70% at the take off of second diagonal artery. Mid to distal LAD have only minimal plaque. D1,2 are fairly small and have diffuse plaque with maximum stenosis 25-50% at the proximal portions. LCX is a large non-dominant artery that gives rise to one large OM1 branch. There is mild calcified plaque in the proximal LCX artery with stenosis 25-50%. OM1 is a large branch with no significant stenosis. Other findings: Normal pulmonary vein drainage into the left atrium. Normal let atrial appendage without a thrombus. IMPRESSION: 1. Coronary calcium score of 288. This was 28 percentile for age and sex matched control. 2. Normal coronary origin with right dominance. 3. Diffuse 2 vessel disease with > 70 stenosis in the proximal RCA and moderate diffuse stenosis in the proximal to mid LAD. Additional analysis with CT FFR will be submitted. 4.  Pulmonary artery is dilated measuring 33 mm. Electronically Signed   By: Tobias Alexander   On: 10/31/2018 13:01   Result Date: 10/31/2018 EXAM: OVER-READ INTERPRETATION  CT CHEST The following report is an over-read performed by radiologist Dr.  Richarda Overlie of [facility] on 10/31/2018. This over-read does not include interpretation of cardiac or coronary anatomy or pathology. The coronary calcium score/coronary CTA interpretation by the cardiologist is attached. COMPARISON:  Abdominal CT 07/18/2014 FINDINGS: Vascular: Normal caliber of the visualized thoracic aorta without atherosclerotic disease. Central pulmonary arteries appear to be patent but incomplete opacification of the pulmonary arteries. No significant pericardial fluid. Mediastinum/Nodes: Mediastinal structures are unremarkable without lymph node enlargement. Lungs/Pleura: Lungs are clear.  No pleural effusions. Upper Abdomen: Cyst in the right kidney upper pole. No acute abnormality in upper abdomen. Musculoskeletal: Again noted  is sclerosis involving the L1 vertebral body. The L1 vertebral body is incompletely evaluated on this study. The other bone structures are unremarkable. There was diffuse sclerosis of the L1 vertebral body on a CT from 07/18/2014. IMPRESSION: 1. Chronic sclerosis involving the L1 vertebral body of uncertain etiology. Similar finding was present in 2015. Findings are nonspecific. Indolent neoplastic process cannot be completely excluded but probably unlikely based on the chronicity. This area is incompletely evaluated and consider lumbar spine radiographs to compare with the CT from 07/18/2014. 2. No acute abnormality in the visualized chest. These results were called by telephone at the time of interpretation on 10/31/2018 at 12:39 pm to Dr. Darnelle Catalan , who verbally acknowledged these results. Electronically Signed: By: Richarda Overlie M.D. On: 10/31/2018 12:39   Ct Coronary Fractional Flow Reserve Fluid Analysis  Result Date: 10/31/2018 EXAM: CT FFR ANALYSIS FINDINGS: FFRct analysis was performed on the original cardiac CT angiogram dataset. Diagrammatic representation of the FFRct analysis is provided in a separate PDF document in PACS. This dictation was  created using the PDF document and an interactive 3D model of the results. 3D model is not available in the EMR/PACS. Normal FFR range is >0.80. 1. Left Main:  No significant stenosis. 2. LAD: Proximal: 0.9, distal 0.83. 3. LCX: No significant stenosis. 4. RCA: Proximal: 0.99, proximal to mid 0.92, distal: 0.79. IMPRESSION: 1. CT FFR analysis showed severe stenosis in the distal RCA. Cardiac catheterization is recommended. Electronically Signed   By: Tobias Alexander   On: 10/31/2018 16:26    Cardiac Studies   Coronary CTA 10/31/18  FINDINGS: FFRct analysis was performed on the original cardiac CT angiogram dataset. Diagrammatic representation of the FFRct analysis is provided in a separate PDF document in PACS. This dictation was created using the PDF document and an interactive 3D model of the results. 3D model is not available in the EMR/PACS. Normal FFR range is >0.80.  1. Left Main:  No significant stenosis.  2. LAD: Proximal: 0.9, distal 0.83. 3. LCX: No significant stenosis. 4. RCA: Proximal: 0.99, proximal to mid 0.92, distal: 0.79.  IMPRESSION: 1. CT FFR analysis showed severe stenosis in the distal RCA. Cardiac catheterization is recommended.  IMPRESSION: 1. Coronary calcium score of 288. This was 31 percentile for age and  sex matched control.  2. Normal coronary origin with right dominance.  3. Diffuse 2 vessel disease with > 70 stenosis in the proximal RCA  and moderate diffuse stenosis in the proximal to mid LAD. Additional  analysis with CT FFR will be submitted.  4.  Pulmonary artery is dilated measuring 33 mm.   2D Echo 10/31/18 Study Conclusions  - Left ventricle: The cavity size was normal. Systolic function was   normal. The estimated ejection fraction was in the range of 55%   to 60%. Wall motion was normal; there were no regional wall   motion abnormalities. Doppler parameters are consistent with   abnormal left ventricular relaxation  (grade 1 diastolic   dysfunction). - Left atrium: The atrium was moderately dilated. - Atrial septum: No defect or patent foramen ovale was identified.   Patient Profile     Matthew Henderson is a 72 y.o. male, retired Psychologist, forensic, with a hx of hypertension and hyperlipidemia admitted for CP. He ruled out for acute MI with negative troponin's but abnormal coronary CTA 10/31/18 showing high grade lesion in the distal RCA w/ positive FFR analysis. EF normal by echo. Plan for cath today.  Assessment & Plan    1. Chest Pain: currently CP free. W/u thus far points to cardiac etiology. Unstable angina based on high grade RCA disease and negative troponins x 3. [facility]. I have reviewed the risks, indications, and alternatives to cardiac catheterization and possible angioplasty/stenting with the patient. Risks include but are not limited to bleeding, infection, vascular injury, stroke, myocardial infection, arrhythmia, kidney injury, radiation-related injury in the case of prolonged fluoroscopy use, emergency cardiac surgery, and death. The patient understands the risks of serious complication is low (<1%).   2. CAD: new diagnosis. Coronary CTA suggest diffuse 2 vessel disease with > 70 stenosis in the proximal RCA and moderate diffuse stenosis in the proximal to mid LAD. Plan definitive cath today. Continue ASA and statin w/ LDL goal <70. He is not currently on a  blocker. Tele reviewed. Resting HR in the upper 60s-70s. BP mildly elevated. May be able to tolerate very low dose of metoprolol 12.5. Also on amlodipine 5 mg.   3. HTN: mildly elevated. Continue amlodipine. Given CAD, consider addition of low dose  blocker.  4. HLD: mentioned in PMH but no lipid panel on file. Given CAD noted on coronary CT, recommended LDL goal is < 70 mg. We will continue on statin and will check FLP. Will adjust regimen if not at goal.   For questions or updates, please contact [facility] Please consult [email or web address] for contact info under        Signed, Robbie LisBrittainy Simmons, PA-C  11/01/2018, 10:28 AM    I have examined the patient and reviewed assessment and plan and discussed with patient.  Agree with above as stated.  Patient with abnormal coronary CT.  Cath showed severe disease in the proximal and distal RCA.  Bother areas successfully stented with DES.  Please see cath report.    Post procedure, he had some bradycardia, perhaps related to jailing of the conus branch by the proximal stent. Transient junctional rhythm.  No chest pain.  Treated with IV fluids and Zofran for his nausea.  Plan to watch overnight.  Possible discharge in AM.   Lance MussJayadeep Jaselyn Nahm

## 2018-11-01 NOTE — Progress Notes (Signed)
PROGRESS NOTE  Nicoletta DressMichael W Holloran ZOX:[id number] DOB: 09/22/1946 DOA: 10/30/2018 PCP: Clovis RileyMitchell, L.August Saucerean, MD  HPI/Recap of past 24 hours:  Late entry  Patient was seen earlier this am prior to transfer to [place]  He denies chest pain, no fever,   Family at bedside  Assessment/Plan: Principal Problem:   Chest pain Active Problems:   Hypertension   Lightheadedness   BPH (benign prostatic hyperplasia)   Hyperlipidemia   Tachycardia   Abnormal findings on diagnostic imaging of spine   Unstable angina (HCC)   Coronary artery calcification seen on CT scan  Chest pain -was seen in the ED for the same a few weeks ago - with cardiac risk factor including HTN/HLD and family history -currently no chest pain, troponin negative -to [place] for cardiac cath due to concerning RCA lesion on CTA -cardiology input appreciated  HTN; need to continue adjust bp   Hyperlipidemia Continue Vytorin.  AKI on CKDII Cr baseline around 0.9-1,  Cr on presentation is 1.36, improving today at 1.16 Ua+ protein, no bacteria  Repeat bmp in am Renal dosing meds  Unchanged diffuse sclerosis of the L1 vertebral body, stable since 2015. This favors a benign process such as Paget's disease or atypical hemangioma, less likely an indolent neoplastic process such as lymphoma. Consider further evaluation with MRI of the lumbar spine with and without contrast. F/u with pcp   BPH (benign prostatic hyperplasia) Continue Proscar.      Code Status: full  Family Communication: patient and family at bedside  Disposition Plan: home with cardiology clearance    Consultants:  cardiology  Procedures:  Cardiac cath on 11/18  Antibiotics:  none   Objective: BP (!) 158/76 (BP Location: Left Arm)   Pulse 72   Temp 98.4 F (36.9 C) (Oral)   Resp 18   Ht 6' (1.829 m)   Wt 97.1 kg   SpO2 100%   BMI 29.04 kg/m   Intake/Output Summary (Last 24 hours) at 11/01/2018 1505 Last data  filed at 11/01/2018 0600 Gross per 24 hour  Intake 382 ml  Output -  Net 382 ml   Filed Weights   10/30/18 2112 10/31/18 0127  Weight: 99.8 kg 97.1 kg    Exam: Patient is examined daily including today on 11/01/2018, exams remain the same as of yesterday except that has changed    General:  NAD  Cardiovascular: RRR  Respiratory: CTABL  Abdomen: Soft/ND/NT, positive BS  Musculoskeletal: No Edema  Neuro: alert, oriented   Data Reviewed: Basic Metabolic Panel: Recent Labs  Lab 10/30/18 2238 11/01/18 0755  NA 139 139  K 3.7 3.7  CL 106 107  CO2 28 25  GLUCOSE 113* 104*  BUN 23 17  CREATININE 1.36* 1.16  CALCIUM 8.9 8.5*   Liver Function Tests: No results for input(s): AST, ALT, ALKPHOS, BILITOT, PROT, ALBUMIN in the last 168 hours. No results for input(s): LIPASE, AMYLASE in the last 168 hours. No results for input(s): AMMONIA in the last 168 hours. CBC: Recent Labs  Lab 10/30/18 2238  WBC 8.4  HGB 13.2  HCT 41.1  MCV 89.9  PLT 164   Cardiac Enzymes:   Recent Labs  Lab 10/31/18 0218 10/31/18 0505 10/31/18 0737  TROPONINI <0.03 <0.03 <0.03   BNP (last 3 results) No results for input(s): BNP in the last 8760 hours.  ProBNP (last 3 results) No results for input(s): PROBNP in the last 8760 hours.  CBG: No results for input(s): GLUCAP in the  last 168 hours.  No results found for this or any previous visit (from the past 240 hour(s)).   Studies: No results found.  Scheduled Meds: . [MAR Hold] amLODipine  5 mg Oral Daily  . [MAR Hold] aspirin EC  81 mg Oral Daily  . [MAR Hold] ezetimibe  10 mg Oral q1800   And  . [MAR Hold] atorvastatin  40 mg Oral q1800  . [MAR Hold] enoxaparin (LOVENOX) injection  40 mg Subcutaneous Daily  . [MAR Hold] finasteride  5 mg Oral Daily  . sodium chloride flush  3 mL Intravenous Q12H    Continuous Infusions: . sodium chloride    . sodium chloride 500 mL/kg/hr (11/01/18 1426)     Time spent: , case  discussed with cardiology I have personally reviewed and interpreted on  11/01/2018 daily labs, tele strips, imagings as discussed above under date review session and assessment and plans.  I reviewed all nursing notes, pharmacy notes, consultant notes,  vitals, pertinent old records  I have discussed plan of care as described above with RN , patient and family on 11/01/2018   Albertine Grates MD, PhD  [facility] Pager [phone]. If 7PM-7AM, please contact night-coverage at [email or web address], password [facility] 11/01/2018, 3:05 PM  LOS: 0 days

## 2018-11-02 ENCOUNTER — Encounter (HOSPITAL_COMMUNITY): Payer: Self-pay | Admitting: Interventional Cardiology

## 2018-11-02 DIAGNOSIS — I2510 Atherosclerotic heart disease of native coronary artery without angina pectoris: Secondary | ICD-10-CM

## 2018-11-02 DIAGNOSIS — Z955 Presence of coronary angioplasty implant and graft: Secondary | ICD-10-CM

## 2018-11-02 DIAGNOSIS — Z9861 Coronary angioplasty status: Secondary | ICD-10-CM

## 2018-11-02 LAB — BASIC METABOLIC PANEL
Anion gap: 7 (ref 5–15)
BUN: 13 mg/dL (ref 8–23)
CALCIUM: 8.7 mg/dL — AB (ref 8.9–10.3)
CHLORIDE: 109 mmol/L (ref 98–111)
CO2: 24 mmol/L (ref 22–32)
CREATININE: 1.41 mg/dL — AB (ref 0.61–1.24)
GFR calc Af Amer: 57 mL/min — ABNORMAL LOW (ref >60)
GFR calc non Af Amer: 49 mL/min — ABNORMAL LOW (ref >60)
GLUCOSE: 95 mg/dL (ref 70–99)
Potassium: 4.2 mmol/L (ref 3.5–5.1)
Sodium: 140 mmol/L (ref 135–145)

## 2018-11-02 LAB — CBC
HEMATOCRIT: 39.9 % (ref 39.0–52.0)
Hemoglobin: 13 g/dL (ref 13.0–17.0)
MCH: 28.4 pg (ref 26.0–34.0)
MCHC: 32.6 g/dL (ref 30.0–36.0)
MCV: 87.3 fL (ref 80.0–100.0)
Platelets: 156 10*3/uL (ref 150–400)
RBC: 4.57 MIL/uL (ref 4.22–5.81)
RDW: 13.9 % (ref 11.5–15.5)
WBC: 8.8 10*3/uL (ref 4.0–10.5)
nRBC: 0 % (ref 0.0–0.2)

## 2018-11-02 LAB — LIPID PANEL
CHOL/HDL RATIO: 3.1 ratio
CHOLESTEROL: 119 mg/dL (ref 0–200)
HDL: 38 mg/dL — ABNORMAL LOW (ref >40)
LDL Cholesterol: 65 mg/dL (ref 0–99)
TRIGLYCERIDES: 81 mg/dL (ref <150)
VLDL: 16 mg/dL (ref 0–40)

## 2018-11-02 MED ORDER — AMLODIPINE BESYLATE 10 MG PO TABS: 10.0000 mg | ORAL_TABLET | Freq: Every day | ORAL | Status: DC

## 2018-11-02 MED ORDER — CARVEDILOL 3.125 MG PO TABS: 6.2500 mg | ORAL_TABLET | Freq: Two times a day (BID) | ORAL | Status: DC

## 2018-11-02 MED ORDER — CARVEDILOL 6.25 MG PO TABS: 6.2500 mg | ORAL_TABLET | Freq: Two times a day (BID) | ORAL | 5 refills | Status: DC

## 2018-11-02 MED ORDER — CLOPIDOGREL BISULFATE 75 MG PO TABS: 75.0000 mg | ORAL_TABLET | Freq: Every day | ORAL | 11 refills | Status: DC

## 2018-11-02 MED ORDER — AMLODIPINE BESYLATE 10 MG PO TABS: 10.0000 mg | ORAL_TABLET | Freq: Every day | ORAL | 5 refills | Status: DC

## 2018-11-02 MED ORDER — ASPIRIN 81 MG PO TBEC: 81.0000 mg | DELAYED_RELEASE_TABLET | Freq: Every day | ORAL | 10 refills | Status: DC

## 2018-11-02 MED ORDER — ATORVASTATIN CALCIUM 40 MG PO TABS: 40.0000 mg | ORAL_TABLET | Freq: Every day | ORAL | 5 refills | Status: DC

## 2018-11-02 MED ORDER — EZETIMIBE 10 MG PO TABS: 10.0000 mg | ORAL_TABLET | Freq: Every day | ORAL | 5 refills | Status: DC

## 2018-11-02 MED ORDER — NITROGLYCERIN 0.4 MG SL SUBL: 0.4000 mg | SUBLINGUAL_TABLET | SUBLINGUAL | 3 refills | Status: DC | PRN

## 2018-11-02 MED FILL — AMLODIPINE BESYLATE 10 MG T: 10 | 30 days supply | Qty: 30 | Fill #0

## 2018-11-02 MED FILL — CARVEDILOL 6.25 MG TABLET: 6.25 | 30 days supply | Qty: 60 | Fill #0

## 2018-11-02 MED FILL — CLOPIDOGREL 75 MG TABLET: 75 | 30 days supply | Qty: 30 | Fill #0

## 2018-11-02 MED FILL — EZETIMIBE 10 MG TABS: 10 | 30 days supply | Qty: 30 | Fill #0

## 2018-11-02 MED FILL — ATORVASTATIN CALCIUM 40 MG: 40 | 30 days supply | Qty: 30 | Fill #0

## 2018-11-02 MED FILL — NITROGLYCERIN 0.4 MG TAB SL: 0.4 | 8 days supply | Qty: 25 | Fill #0

## 2018-11-02 MED FILL — ASPIRIN LOW DOSE 81 MG TBEC: 81 | 30 days supply | Qty: 30 | Fill #0

## 2018-11-02 MED FILL — Tirofiban HCl in NaCl 0.9% IV Soln 5 MG/100ML (Base Equiv): INTRAVENOUS | Qty: 100 | Status: AC

## 2018-11-02 NOTE — Discharge Summary (Signed)
Discharge Summary    Patient ID: Matthew Henderson MRN: [id number]; DOB: [birth year]  Admit date: 10/30/2018 Discharge date: 11/02/2018  Primary Care Provider: Clovis Riley, L.August Saucer, MD  Primary Cardiologist: Lance Muss, MD  Primary Electrophysiologist:  None   Discharge Diagnoses    Principal Problem:   Unstable angina [facility]) Active Problems:   CAD S/P percutaneous coronary angioplasty- DES to prox RCA, DES to distal RCA 11/01/18   Chest pain   Hypertension   Lightheadedness   BPH (benign prostatic hyperplasia)   Hyperlipidemia   Tachycardia   Abnormal findings on diagnostic imaging of spine   Coronary artery calcification seen on CT scan   Allergies No Known Allergies  Diagnostic Studies/Procedures   Coronary CTA 10/31/18 FINDINGS: FFRct analysis was performed on the original cardiac CT angiogram dataset. Diagrammatic representation of the FFRct analysis is provided in a separate PDF document in PACS. This dictation was created using the PDF document and an interactive 3D model of the results. 3D model is not available in the EMR/PACS. Normal FFR range is >0.80.  1. Left Main: No significant stenosis.  2. LAD: Proximal: 0.9, distal 0.83. 3. LCX: No significant stenosis. 4. RCA: Proximal: 0.99, proximal to mid 0.92, distal: 0.79.  IMPRESSION: 1. CT FFR analysis showed severe stenosis in the distal RCA. Cardiac catheterization is recommended.  IMPRESSION: 1. Coronary calcium score of 288. This was 60 percentile for age and  sex matched control.  2. Normal coronary origin with right dominance.  3. Diffuse 2 vessel disease with > 70 stenosis in the proximal RCA  and moderate diffuse stenosis in the proximal to mid LAD. Additional  analysis with CT FFR will be submitted.  4. Pulmonary artery is dilated measuring 33 mm.   2D Echo 10/31/18 Study Conclusions  - Left ventricle: The cavity size was normal. Systolic function was normal.  The estimated ejection fraction was in the range of 55% to 60%. Wall motion was normal; there were no regional wall motion abnormalities. Doppler parameters are consistent with abnormal left ventricular relaxation (grade 1 diastolic dysfunction). - Left atrium: The atrium was moderately dilated. - Atrial septum: No defect or patent foramen ovale was identified.  LHC 11/01/18:  Prox RCA lesion is 80% stenosed.  A drug-eluting stent was successfully placed using a STENT SYNERGY DES 3X24.  Post intervention, there is a 0% residual stenosis.  Dist RCA lesion is 80% stenosed.  A drug-eluting stent was successfully placed using a STENT SYNERGY DES 2.5X20.  Post intervention, there is a 0% residual stenosis.  Mid RCA lesion is 25% stenosed.  Mid LAD lesion is 25% stenosed.  Prox Cx lesion is 25% stenosed.  The left ventricular systolic function is normal.  The left ventricular ejection fraction is 50-55% by visual estimate.  LV end diastolic pressure is normal.  There is no aortic valve stenosis.   History of Present Illness     Matthew Huntsman Porteris a 72 y.o.male, retired high school teacher,with a hx of hypertension and hyperlipidemia but no prior cardiac history who presented to the [facility] ED on 10/30/18 with CC of CP.  Pt endorsed on and off CP, occurring at rest and centrally located and pressure like sensation. He also noted change in exercise tolerance, with more fatigue with activities such as playing golf.   Night of admission, he had developed more severe CP that would not resolve and no improvement with baby ASA.   In ED, EKG showed NSR, HR 83 bpm with no ischemic changes. POC  troponin was negative. BP elevated at 179/97. BMP c/w renal insuffiencey w/ SCr at 1.36 (baseline ~1.0). IM admitted pt for further w/u.    Hospital Course     Pt was admitted to telemetry. His HCTZ was held due to renal insuffiencey. He ruled out for acute MI with negative troponin's x  3. Cardiology consultation was requested and pt was seen by Dr. Delton See, who recommended coronary CTA. Study was abnormal showing high grade lesions in the proximal and distal RCA w/ positive FFR analysis. EF normal by echo. He was transferred to [facility] for definitive LHC. Procedure was performed by Dr. Eldridge Dace and showed 80% proximal RCA stenosis, treated with PCI + DES and an 80% distal RCA stenosis also treated w/ PCI +DES. There is mild nonobstructive disease in the mid RCA, mid LAD and prox LCx, to be treated medically. Cath also demonstrated normal LVEF at 50-55%, normal LVEDP and no aortic valve stenosis. He tolerated the procedure well and left the cath lab in stable condition. He was placed on DAPT w/ ASA and Plavix. Lipid panel showed LDL at 65 mg/dL. His home simvastatin/ezetimibe was switched to atorvastatin and ezetimibe. His home HCTZ was discontinued due to renal insuffiencey. Scr increased further to 1.4. His amlodipine dose was increased to 10 mg and carvedilol was added, 6.25 mg BID. He was monitored overnight and had no post cath complications. His cath access site remained stable. No recurrent CP. He ambulated w/ cardiac rehab w/o difficulty. He was last seen and examined by Dr. Duke Salvia, who determined he was stable for discharge home. She recommended that he keep track of his BP at home and bring log to clinic f/u. He will also need repeat FLP and HFTs in 6-8 weeks. Post hospital has been arranged with Dr. Eldridge Dace on 11/17/18.   Consultants: Cardiology   Discharge Vitals Blood pressure (!) 160/82, pulse 80, temperature 97.7 F (36.5 C), temperature source Oral, resp. rate 18, height 6' (1.829 m), weight 97.7 kg, SpO2 97 %.  Filed Weights   10/30/18 2112 10/31/18 0127 11/02/18 0417  Weight: 99.8 kg 97.1 kg 97.7 kg    Labs & Radiologic Studies    CBC Recent Labs    10/30/18 2238 11/02/18 0348  WBC 8.4 8.8  HGB 13.2 13.0  HCT 41.1 39.9  MCV 89.9 87.3  PLT 164 156   Basic  Metabolic Panel Recent Labs    24/40/10 0755 11/02/18 0348  NA 139 140  K 3.7 4.2  CL 107 109  CO2 25 24  GLUCOSE 104* 95  BUN 17 13  CREATININE 1.16 1.41*  CALCIUM 8.5* 8.7*   Liver Function Tests No results for input(s): AST, ALT, ALKPHOS, BILITOT, PROT, ALBUMIN in the last 72 hours. No results for input(s): LIPASE, AMYLASE in the last 72 hours. Cardiac Enzymes Recent Labs    10/31/18 0218 10/31/18 0505 10/31/18 0737  TROPONINI <0.03 <0.03 <0.03   BNP Invalid input(s): POCBNP D-Dimer No results for input(s): DDIMER in the last 72 hours. Hemoglobin A1C No results for input(s): HGBA1C in the last 72 hours. Fasting Lipid Panel Recent Labs    11/02/18 0348  CHOL 119  HDL 38*  LDLCALC 65  TRIG 81  CHOLHDL 3.1   Thyroid Function Tests No results for input(s): TSH, T4TOTAL, T3FREE, THYROIDAB in the last 72 hours.  Invalid input(s): FREET3 _____________  Dg Chest 2 View  Addendum Date: 10/31/2018   ADDENDUM REPORT: 10/31/2018 12:48 CLINICAL DATA:  72 year old male with hypertension, hyperlipidemia and  chest pain. EXAM: Cardiac/Coronary  CT TECHNIQUE: The patient was scanned on a [employer]. FINDINGS: A 120 kV prospective scan was triggered in the descending thoracic aorta at 111 HU's. Axial non-contrast 3 mm slices were carried out through the heart. The data set was analyzed on a dedicated work station and scored using the Agatson method. Gantry rotation speed was 250 msecs and collimation was .6 mm. No beta blockade and 0.8 mg of sl NTG was given. The 3D data set was reconstructed in 5% intervals of the 67-82 % of the R-R cycle. Diastolic phases were analyzed on a dedicated work station using MPR, MIP and VRT modes. The patient received 80 cc of contrast. Aorta: Normal size. No calcifications, mild diffuse atheroma in the descending aorta. No dissection. Aortic Valve:  Trileaflet.  Trivial calcifications. Coronary Arteries:  Normal coronary origin.  Right  dominance. RCA is a large dominant artery that gives rise to PDA and PLVB. There is severe predominantly non-calcified plaque in the proximal RCA with high risk features (napkin ring sign) suspicious for stenosis > 70%. Mid RCA has diffuse mixed, predominantly calcified plaque with a focal stenosis 50-69%. Distal RCA/PDA/PLA have only minimal plaque. Left main is a large and long artery that gives rise to LAD and LCX arteries. Distal left main has stenosis 0-25%. LAD is a large vessel that gives rise to two diagonal arteries and wraps around the apex. Proximal to mid LAD has moderate mixed plaque with diffuse stenosis 50-69% and suspicion for a focal stenosis >70% at the take off of second diagonal artery. Mid to distal LAD have only minimal plaque. D1,2 are fairly small and have diffuse plaque with maximum stenosis 25-50% at the proximal portions. LCX is a large non-dominant artery that gives rise to one large OM1 branch. There is mild calcified plaque in the proximal LCX artery with stenosis 25-50%. OM1 is a large branch with no significant stenosis. Other findings: Normal pulmonary vein drainage into the left atrium. Normal let atrial appendage without a thrombus. IMPRESSION: 1. Coronary calcium score of 288. This was 32 percentile for age and sex matched control. 2. Normal coronary origin with right dominance. 3. Diffuse 2 vessel disease with > 70 stenosis in the proximal RCA and moderate diffuse stenosis in the proximal to mid LAD. Additional analysis with CT FFR will be submitted. 4.  Pulmonary artery is dilated measuring 33 mm. Electronically Signed   By: Tobias Alexander   On: 10/31/2018 12:48   Result Date: 10/31/2018 CLINICAL DATA:  Acute onset of chest tightness and generalized weakness. EXAM: CHEST - 2 VIEW COMPARISON:  Chest radiograph performed 10/03/2018 FINDINGS: The lungs are well-aerated and clear. There is no evidence of focal opacification, pleural effusion or pneumothorax. The heart is normal  in size; the mediastinal contour is within normal limits. No acute osseous abnormalities are seen. IMPRESSION: No acute cardiopulmonary process seen. Electronically Signed: By: Roanna Raider M.D. On: 10/30/2018 21:40   Dg Chest 2 View  Result Date: 10/03/2018 CLINICAL DATA:  Chest pain EXAM: CHEST - 2 VIEW COMPARISON:  05/27/2015 FINDINGS: Heart and mediastinal contours are within normal limits. No focal opacities or effusions. No acute bony abnormality. IMPRESSION: No active cardiopulmonary disease. Electronically Signed   By: Charlett Nose M.D.   On: 10/03/2018 22:44   Dg Lumbar Spine 2-3 Views  Result Date: 10/31/2018 CLINICAL DATA:  L1 sclerosis. EXAM: LUMBAR SPINE - 2-3 VIEW COMPARISON:  CT abdomen pelvis dated July 18, 2014. FINDINGS: Five lumbar  type vertebral bodies. No acute fracture or subluxation. Vertebral body heights are preserved. Alignment is normal. Mild-to-moderate multilevel disc height loss throughout the lumbar spine, worst at L4-L5, similar to prior studies. Unchanged diffuse sclerosis of the L1 vertebral body with slightly coarsened trabeculae. Excreted contrast within the renal collecting systems, ureters, and bladder. IMPRESSION: Unchanged diffuse sclerosis of the L1 vertebral body, stable since 2015. This favors a benign process such as Paget's disease or atypical hemangioma, less likely an indolent neoplastic process such as lymphoma. Consider further evaluation with MRI of the lumbar spine with and without contrast. Electronically Signed   By: Obie DredgeWilliam T Derry M.D.   On: 10/31/2018 13:41   Ct Coronary Morph W/cta Cor W/score W/ca W/cm &/or Wo/cm  Addendum Date: 10/31/2018   ADDENDUM REPORT: 10/31/2018 16:23 ADDENDUM: Correction: There are two tandem lesions in the distal RCA - the first one is severe noncalcified with associated stenosis suspicious > 70% stenosis, the second one moderate non-calcified with stenosis 50-69%. Electronically Signed   By: Tobias AlexanderKatarina  Nelson   On:  10/31/2018 16:23   Addendum Date: 10/31/2018   ADDENDUM REPORT: 10/31/2018 13:01 CLINICAL DATA:  72 year old male with hypertension, hyperlipidemia and chest pain. EXAM: Cardiac/Coronary  CT TECHNIQUE: The patient was scanned on a [employer] scanner. FINDINGS: A 120 kV prospective scan was triggered in the descending thoracic aorta at 111 HU's. Axial non-contrast 3 mm slices were carried out through the heart. The data set was analyzed on a dedicated work station and scored using the Agatson method. Gantry rotation speed was 250 msecs and collimation was .6 mm. No beta blockade and 0.8 mg of sl NTG was given. The 3D data set was reconstructed in 5% intervals of the 67-82 % of the R-R cycle. Diastolic phases were analyzed on a dedicated work station using MPR, MIP and VRT modes. The patient received 80 cc of contrast. Aorta: Normal size. No calcifications, mild diffuse atheroma in the descending aorta. No dissection. Aortic Valve:  Trileaflet.  Trivial calcifications. Coronary Arteries:  Normal coronary origin.  Right dominance. RCA is a large dominant artery that gives rise to PDA and PLVB. There is severe predominantly non-calcified plaque in the proximal RCA with high risk features (napkin ring sign) suspicious for stenosis > 70%. Mid RCA has diffuse mixed, predominantly calcified plaque with a focal stenosis 50-69%. Distal RCA/PDA/PLA have only minimal plaque. Left main is a large and long artery that gives rise to LAD and LCX arteries. Distal left main has stenosis 0-25%. LAD is a large vessel that gives rise to two diagonal arteries and wraps around the apex. Proximal to mid LAD has moderate mixed plaque with diffuse stenosis 50-69% and suspicion for a focal stenosis >70% at the take off of second diagonal artery. Mid to distal LAD have only minimal plaque. D1,2 are fairly small and have diffuse plaque with maximum stenosis 25-50% at the proximal portions. LCX is a large non-dominant artery that gives  rise to one large OM1 branch. There is mild calcified plaque in the proximal LCX artery with stenosis 25-50%. OM1 is a large branch with no significant stenosis. Other findings: Normal pulmonary vein drainage into the left atrium. Normal let atrial appendage without a thrombus. IMPRESSION: 1. Coronary calcium score of 288. This was 481 percentile for age and sex matched control. 2. Normal coronary origin with right dominance. 3. Diffuse 2 vessel disease with > 70 stenosis in the proximal RCA and moderate diffuse stenosis in the proximal to mid LAD. Additional analysis  with CT FFR will be submitted. 4.  Pulmonary artery is dilated measuring 33 mm. Electronically Signed   By: Tobias Alexander   On: 10/31/2018 13:01   Result Date: 10/31/2018 EXAM: OVER-READ INTERPRETATION  CT CHEST The following report is an over-read performed by radiologist Dr. Richarda Overlie of [facility] on 10/31/2018. This over-read does not include interpretation of cardiac or coronary anatomy or pathology. The coronary calcium score/coronary CTA interpretation by the cardiologist is attached. COMPARISON:  Abdominal CT 07/18/2014 FINDINGS: Vascular: Normal caliber of the visualized thoracic aorta without atherosclerotic disease. Central pulmonary arteries appear to be patent but incomplete opacification of the pulmonary arteries. No significant pericardial fluid. Mediastinum/Nodes: Mediastinal structures are unremarkable without lymph node enlargement. Lungs/Pleura: Lungs are clear.  No pleural effusions. Upper Abdomen: Cyst in the right kidney upper pole. No acute abnormality in upper abdomen. Musculoskeletal: Again noted is sclerosis involving the L1 vertebral body. The L1 vertebral body is incompletely evaluated on this study. The other bone structures are unremarkable. There was diffuse sclerosis of the L1 vertebral body on a CT from 07/18/2014. IMPRESSION: 1. Chronic sclerosis involving the L1 vertebral body of uncertain etiology.  Similar finding was present in 2015. Findings are nonspecific. Indolent neoplastic process cannot be completely excluded but probably unlikely based on the chronicity. This area is incompletely evaluated and consider lumbar spine radiographs to compare with the CT from 07/18/2014. 2. No acute abnormality in the visualized chest. These results were called by telephone at the time of interpretation on 10/31/2018 at 12:39 pm to Dr. Darnelle Catalan , who verbally acknowledged these results. Electronically Signed: By: Richarda Overlie M.D. On: 10/31/2018 12:39   Ct Coronary Fractional Flow Reserve Fluid Analysis  Result Date: 10/31/2018 EXAM: CT FFR ANALYSIS FINDINGS: FFRct analysis was performed on the original cardiac CT angiogram dataset. Diagrammatic representation of the FFRct analysis is provided in a separate PDF document in PACS. This dictation was created using the PDF document and an interactive 3D model of the results. 3D model is not available in the EMR/PACS. Normal FFR range is >0.80. 1. Left Main:  No significant stenosis. 2. LAD: Proximal: 0.9, distal 0.83. 3. LCX: No significant stenosis. 4. RCA: Proximal: 0.99, proximal to mid 0.92, distal: 0.79. IMPRESSION: 1. CT FFR analysis showed severe stenosis in the distal RCA. Cardiac catheterization is recommended. Electronically Signed   By: Tobias Alexander   On: 10/31/2018 16:26   Disposition   Pt is being discharged home today in good condition.  Follow-up Plans & Appointments    Follow-up Information    Clovis Riley, L.August Saucer, MD Follow up.   Specialty:  Family Medicine Why:  Unchanged diffuse sclerosis of the L1 vertebral body, stable since 2015. Consider further evaluation with MRI of the lumbar spine w/wo contrast. Contact information: [address] [place] [address] [phone]        Corky Crafts, MD Follow up on 11/17/2018.   Specialties:  Cardiology, Radiology, Interventional Cardiology Why:  8:40 AM Contact  information: [address] [place] [address] [phone]          Discharge Instructions    Amb Referral to Cardiac Rehabilitation   Complete by:  As directed    Diagnosis:  Coronary Stents   Diet - low sodium heart healthy   Complete by:  As directed    Increase activity slowly   Complete by:  As directed       Discharge Medications   Allergies  as of 11/02/2018   No Known Allergies     Medication List    STOP taking these medications   ezetimibe-simvastatin 10-80 MG tablet Commonly known as:  VYTORIN   hydrochlorothiazide 25 MG tablet Commonly known as:  HYDRODIURIL     TAKE these medications   amLODipine 10 MG tablet Commonly known as:  NORVASC Take 1 tablet (10 mg total) by mouth daily. Start taking on:  11/03/2018   aspirin 81 MG EC tablet Take 1 tablet (81 mg total) by mouth daily. Start taking on:  11/03/2018 What changed:  how much to take   atorvastatin 40 MG tablet Commonly known as:  LIPITOR Take 1 tablet (40 mg total) by mouth daily at 6 PM.   carvedilol 6.25 MG tablet Commonly known as:  COREG Take 1 tablet (6.25 mg total) by mouth 2 (two) times daily with a meal.   clopidogrel 75 MG tablet Commonly known as:  PLAVIX Take 1 tablet (75 mg total) by mouth daily with breakfast. Start taking on:  11/03/2018   ezetimibe 10 MG tablet Commonly known as:  ZETIA Take 1 tablet (10 mg total) by mouth daily at 6 PM.   finasteride 5 MG tablet Commonly known as:  PROSCAR Take 5 mg by mouth daily.   FISH OIL PO Take 2 capsules by mouth daily.   nitroGLYCERIN 0.4 MG SL tablet Commonly known as:  NITROSTAT Place 1 tablet (0.4 mg total) under the tongue every 5 (five) minutes as needed for chest pain.        Acute coronary syndrome (MI, NSTEMI, STEMI, etc) this admission?: Yes.   Unstable Angina   AHA/ACC Clinical Performance & Quality Measures: 1. Aspirin prescribed? - Yes 2. ADP Receptor Inhibitor  (Plavix/Clopidogrel, Brilinta/Ticagrelor or Effient/Prasugrel) prescribed (includes medically managed patients)? - Yes 3. Beta Blocker prescribed? - Yes 4. High Intensity Statin (Lipitor 40-80mg  or Crestor 20-40mg ) prescribed? - Yes 5. EF assessed during THIS hospitalization? - Yes 6. For EF <40%, was ACEI/ARB prescribed? - Not Applicable (EF >/= 40%) 7. For EF <40%, Aldosterone Antagonist (Spironolactone or Eplerenone) prescribed? - Not Applicable (EF >/= 40%) 8. Cardiac Rehab Phase II ordered (Included Medically managed Patients)? - Yes     Outstanding Labs/Studies   BMP at post hospital f/u  FLP and HFTs in 6-8 weeks  Duration of Discharge Encounter   Greater than 30 minutes including physician time.  Signed, Robbie Lis, PA-C 11/02/2018, 10:58 AM

## 2018-11-02 NOTE — Progress Notes (Signed)
CARDIAC REHAB PHASE I   PRE:  Rate/Rhythm: 80 SR    BP: sitting 170/72    SaO2:   MODE:  Ambulation: 450 ft   POST:  Rate/Rhythm: 102 ST    BP: sitting 174/70     SaO2:   Tolerated well, no c/o. BP elevated, no meds yet. Ed completed with good comprehension. Understands importance of Plavix/ASA. Discussed risk factor modification, NTG, and CRPII. Will refer to [place] CRPII. [id number]   Matthew MassonRandi Kristan [name] CES, ACSM 11/02/2018 8:50 AM

## 2018-11-02 NOTE — Progress Notes (Signed)
Progress Note  Patient Name: Matthew Henderson Date of Encounter: 11/02/2018  Primary Cardiologist: Tobias Alexander, MD   Subjective   Feeling well.  No chest pain or shortness of breath.  Inpatient Medications    Scheduled Meds: . amLODipine  5 mg Oral Daily  . aspirin EC  81 mg Oral Daily  . ezetimibe  10 mg Oral q1800   And  . atorvastatin  40 mg Oral q1800  . clopidogrel  75 mg Oral Q breakfast  . enoxaparin (LOVENOX) injection  40 mg Subcutaneous Daily  . finasteride  5 mg Oral Daily  . sodium chloride flush  3 mL Intravenous Q12H   Continuous Infusions: . sodium chloride     PRN Meds: sodium chloride, acetaminophen, ALPRAZolam, hydrALAZINE, morphine injection, ondansetron (ZOFRAN) IV, sodium chloride flush   Vital Signs    Vitals:   11/01/18 1845 11/01/18 1930 11/01/18 2000 11/02/18 0417  BP: (!) 157/78 (!) 168/73 (!) 157/78 137/75  Pulse: 71 70 69 69  Resp: (!) 21 14 (!) 22 20  Temp:  98.6 F (37 C)  97.8 F (36.6 C)  TempSrc:  Oral  Oral  SpO2: 98% 98% 96% 100%  Weight:    97.7 kg  Height:        Intake/Output Summary (Last 24 hours) at 11/02/2018 0813 Last data filed at 11/02/2018 0420 Gross per 24 hour  Intake 340 ml  Output 1400 ml  Net -1060 ml   Filed Weights   10/30/18 2112 10/31/18 0127 11/02/18 0417  Weight: 99.8 kg 97.1 kg 97.7 kg    Telemetry    Sinus rhythm.  Rate 60s to 90s.  PVCs.- Personally Reviewed  ECG    Sinus rhythm.  Rate 68 bpm. - Personally Reviewed  Physical Exam   VS:  BP (!) 160/82   Pulse 80   Temp 97.7 F (36.5 C) (Oral)   Resp 18   Ht 6' (1.829 m)   Wt 97.7 kg   SpO2 97%   BMI 29.21 kg/m  , BMI Body mass index is 29.21 kg/m. GENERAL:  Well appearing HEENT: Pupils equal round and reactive, fundi not visualized, oral mucosa unremarkable NECK:  No jugular venous distention, waveform within normal limits, carotid upstroke brisk and symmetric, no bruits LUNGS:  Clear to auscultation bilaterally HEART:   RRR.  PMI not displaced or sustained,S1 and S2 within normal limits, no S3, no S4, no clicks, no rubs, no murmurs ABD:  Flat, positive bowel sounds normal in frequency in pitch, no bruits, no rebound, no guarding, no midline pulsatile mass, no hepatomegaly, no splenomegaly EXT:  2 plus pulses throughout, no edema, no cyanosis no clubbing.  Right radial cath site without hematoma or bruising. SKIN:  No rashes no nodules NEURO:  Cranial nerves II through XII grossly intact, motor grossly intact throughout [facility]:  Cognitively intact, oriented to person place and time   Labs    Chemistry Recent Labs  Lab 10/30/18 2238 11/01/18 0755 11/02/18 0348  NA 139 139 140  K 3.7 3.7 4.2  CL 106 107 109  CO2 28 25 24   GLUCOSE 113* 104* 95  BUN 23 17 13   CREATININE 1.36* 1.16 1.41*  CALCIUM 8.9 8.5* 8.7*  GFRNONAA 51* >60 49*  GFRAA 59* >60 57*  ANIONGAP 5 7 7      Hematology Recent Labs  Lab 10/30/18 2238 11/02/18 0348  WBC 8.4 8.8  RBC 4.57 4.57  HGB 13.2 13.0  HCT 41.1 39.9  MCV  89.9 87.3  MCH 28.9 28.4  MCHC 32.1 32.6  RDW 13.9 13.9  PLT 164 156    Cardiac Enzymes Recent Labs  Lab 10/31/18 0218 10/31/18 0505 10/31/18 0737  TROPONINI <0.03 <0.03 <0.03    Recent Labs  Lab 10/30/18 2246  TROPIPOC 0.02     BNPNo results for input(s): BNP, PROBNP in the last 168 hours.   DDimer No results for input(s): DDIMER in the last 168 hours.   Radiology    Dg Lumbar Spine 2-3 Views  Result Date: 10/31/2018 CLINICAL DATA:  L1 sclerosis. EXAM: LUMBAR SPINE - 2-3 VIEW COMPARISON:  CT abdomen pelvis dated July 18, 2014. FINDINGS: Five lumbar type vertebral bodies. No acute fracture or subluxation. Vertebral body heights are preserved. Alignment is normal. Mild-to-moderate multilevel disc height loss throughout the lumbar spine, worst at L4-L5, similar to prior studies. Unchanged diffuse sclerosis of the L1 vertebral body with slightly coarsened trabeculae. Excreted contrast  within the renal collecting systems, ureters, and bladder. IMPRESSION: Unchanged diffuse sclerosis of the L1 vertebral body, stable since 2015. This favors a benign process such as Paget's disease or atypical hemangioma, less likely an indolent neoplastic process such as lymphoma. Consider further evaluation with MRI of the lumbar spine with and without contrast. Electronically Signed   By: Obie DredgeWilliam T Derry M.D.   On: 10/31/2018 13:41   Ct Coronary Morph W/cta Cor W/score W/ca W/cm &/or Wo/cm  Addendum Date: 10/31/2018   ADDENDUM REPORT: 10/31/2018 16:23 ADDENDUM: Correction: There are two tandem lesions in the distal RCA - the first one is severe noncalcified with associated stenosis suspicious > 70% stenosis, the second one moderate non-calcified with stenosis 50-69%. Electronically Signed   By: Tobias AlexanderKatarina  Nelson   On: 10/31/2018 16:23   Addendum Date: 10/31/2018   ADDENDUM REPORT: 10/31/2018 13:01 CLINICAL DATA:  72 year old male with hypertension, hyperlipidemia and chest pain. EXAM: Cardiac/Coronary  CT TECHNIQUE: The patient was scanned on a [employer] scanner. FINDINGS: A 120 kV prospective scan was triggered in the descending thoracic aorta at 111 HU's. Axial non-contrast 3 mm slices were carried out through the heart. The data set was analyzed on a dedicated work station and scored using the Agatson method. Gantry rotation speed was 250 msecs and collimation was .6 mm. No beta blockade and 0.8 mg of sl NTG was given. The 3D data set was reconstructed in 5% intervals of the 67-82 % of the R-R cycle. Diastolic phases were analyzed on a dedicated work station using MPR, MIP and VRT modes. The patient received 80 cc of contrast. Aorta: Normal size. No calcifications, mild diffuse atheroma in the descending aorta. No dissection. Aortic Valve:  Trileaflet.  Trivial calcifications. Coronary Arteries:  Normal coronary origin.  Right dominance. RCA is a large dominant artery that gives rise to PDA and  PLVB. There is severe predominantly non-calcified plaque in the proximal RCA with high risk features (napkin ring sign) suspicious for stenosis > 70%. Mid RCA has diffuse mixed, predominantly calcified plaque with a focal stenosis 50-69%. Distal RCA/PDA/PLA have only minimal plaque. Left main is a large and long artery that gives rise to LAD and LCX arteries. Distal left main has stenosis 0-25%. LAD is a large vessel that gives rise to two diagonal arteries and wraps around the apex. Proximal to mid LAD has moderate mixed plaque with diffuse stenosis 50-69% and suspicion for a focal stenosis >70% at the take off of second diagonal artery. Mid to distal LAD have only minimal plaque. D1,2 are  fairly small and have diffuse plaque with maximum stenosis 25-50% at the proximal portions. LCX is a large non-dominant artery that gives rise to one large OM1 branch. There is mild calcified plaque in the proximal LCX artery with stenosis 25-50%. OM1 is a large branch with no significant stenosis. Other findings: Normal pulmonary vein drainage into the left atrium. Normal let atrial appendage without a thrombus. IMPRESSION: 1. Coronary calcium score of 288. This was 46 percentile for age and sex matched control. 2. Normal coronary origin with right dominance. 3. Diffuse 2 vessel disease with > 70 stenosis in the proximal RCA and moderate diffuse stenosis in the proximal to mid LAD. Additional analysis with CT FFR will be submitted. 4.  Pulmonary artery is dilated measuring 33 mm. Electronically Signed   By: Tobias Alexander   On: 10/31/2018 13:01   Result Date: 10/31/2018 EXAM: OVER-READ INTERPRETATION  CT CHEST The following report is an over-read performed by radiologist Dr. Richarda Overlie of [facility] on 10/31/2018. This over-read does not include interpretation of cardiac or coronary anatomy or pathology. The coronary calcium score/coronary CTA interpretation by the cardiologist is attached. COMPARISON:   Abdominal CT 07/18/2014 FINDINGS: Vascular: Normal caliber of the visualized thoracic aorta without atherosclerotic disease. Central pulmonary arteries appear to be patent but incomplete opacification of the pulmonary arteries. No significant pericardial fluid. Mediastinum/Nodes: Mediastinal structures are unremarkable without lymph node enlargement. Lungs/Pleura: Lungs are clear.  No pleural effusions. Upper Abdomen: Cyst in the right kidney upper pole. No acute abnormality in upper abdomen. Musculoskeletal: Again noted is sclerosis involving the L1 vertebral body. The L1 vertebral body is incompletely evaluated on this study. The other bone structures are unremarkable. There was diffuse sclerosis of the L1 vertebral body on a CT from 07/18/2014. IMPRESSION: 1. Chronic sclerosis involving the L1 vertebral body of uncertain etiology. Similar finding was present in 2015. Findings are nonspecific. Indolent neoplastic process cannot be completely excluded but probably unlikely based on the chronicity. This area is incompletely evaluated and consider lumbar spine radiographs to compare with the CT from 07/18/2014. 2. No acute abnormality in the visualized chest. These results were called by telephone at the time of interpretation on 10/31/2018 at 12:39 pm to Dr. Darnelle Catalan , who verbally acknowledged these results. Electronically Signed: By: Richarda Overlie M.D. On: 10/31/2018 12:39   Ct Coronary Fractional Flow Reserve Fluid Analysis  Result Date: 10/31/2018 EXAM: CT FFR ANALYSIS FINDINGS: FFRct analysis was performed on the original cardiac CT angiogram dataset. Diagrammatic representation of the FFRct analysis is provided in a separate PDF document in PACS. This dictation was created using the PDF document and an interactive 3D model of the results. 3D model is not available in the EMR/PACS. Normal FFR range is >0.80. 1. Left Main:  No significant stenosis. 2. LAD: Proximal: 0.9, distal 0.83. 3. LCX: No significant  stenosis. 4. RCA: Proximal: 0.99, proximal to mid 0.92, distal: 0.79. IMPRESSION: 1. CT FFR analysis showed severe stenosis in the distal RCA. Cardiac catheterization is recommended. Electronically Signed   By: Tobias Alexander   On: 10/31/2018 16:26    Cardiac Studies   Coronary CTA 10/31/18  FINDINGS: FFRct analysis was performed on the original cardiac CT angiogram dataset. Diagrammatic representation of the FFRct analysis is provided in a separate PDF document in PACS. This dictation was created using the PDF document and an interactive 3D model of the results. 3D model is not available in the EMR/PACS. Normal FFR range is >0.80.  1.  Left Main: No significant stenosis.  2. LAD: Proximal: 0.9, distal 0.83. 3. LCX: No significant stenosis. 4. RCA: Proximal: 0.99, proximal to mid 0.92, distal: 0.79.  IMPRESSION: 1. CT FFR analysis showed severe stenosis in the distal RCA. Cardiac catheterization is recommended.  IMPRESSION: 1. Coronary calcium score of 288. This was 68 percentile for age and  sex matched control.  2. Normal coronary origin with right dominance.  3. Diffuse 2 vessel disease with > 70 stenosis in the proximal RCA  and moderate diffuse stenosis in the proximal to mid LAD. Additional  analysis with CT FFR will be submitted.  4. Pulmonary artery is dilated measuring 33 mm.   2D Echo 10/31/18 Study Conclusions  - Left ventricle: The cavity size was normal. Systolic function was normal. The estimated ejection fraction was in the range of 55% to 60%. Wall motion was normal; there were no regional wall motion abnormalities. Doppler parameters are consistent with abnormal left ventricular relaxation (grade 1 diastolic dysfunction). - Left atrium: The atrium was moderately dilated. - Atrial septum: No defect or patent foramen ovale was identified.  LHC 11/01/18:   Prox RCA lesion is 80% stenosed.  A drug-eluting stent was  successfully placed using a STENT SYNERGY DES 3X24.  Post intervention, there is a 0% residual stenosis.  Dist RCA lesion is 80% stenosed.  A drug-eluting stent was successfully placed using a STENT SYNERGY DES 2.5X20.  Post intervention, there is a 0% residual stenosis.  Mid RCA lesion is 25% stenosed.  Mid LAD lesion is 25% stenosed.  Prox Cx lesion is 25% stenosed.  The left ventricular systolic function is normal.  The left ventricular ejection fraction is 50-55% by visual estimate.  LV end diastolic pressure is normal.  There is no aortic valve stenosis.  Patient Profile     72 y.o. male with hypertension and hyperlipidemia admitted with unstable angina  Assessment & Plan    # Unstable angina: # CAD: # Hyperlipidemia: Mr. Dahmen presented with unstable angina.  Cardiac enzymes were negative and EKG with was without ischemic changes.  He underwent a coronary CT-A  that revealed greater than 70% proximal RCA stenosis.  Significant findings were confirmed on CT FFR.  He underwent PCI of the proximal and distal RCA on 11/01/18 with synergy drug-eluting stents.  His home simvastatin/ezetimibe was switched to atorvastatin and ezetimibe.  He will need repeat lipids and CMP in 6 to 8 weeks.  Carvedilol was added 11/02/2018.  # Hypertension: Home HCTZ was held in the setting of acute renal failure.  Blood pressure is poorly controlled.  We will increase amlodipine to 10 mg.  Add carvedilol 6.25 mg every 12 hours.  I am asked him to track his blood pressure at home and bring to follow-up.  # Bradycardia:  Resolved after RCA PCI.  Starting carvedilol as above.  For questions or updates, please contact [facility] Please consult [email or web address] for contact info under        Signed, Chilton Si, MD  11/02/2018, 8:13 AM

## 2018-11-04 ENCOUNTER — Telehealth (HOSPITAL_COMMUNITY): Payer: Self-pay

## 2018-11-04 NOTE — Telephone Encounter (Signed)
Pt returned CR phone call and will like to participate in Cardiac Rehab. Patient will come in for orientation on 12/30/2018 @ 1:30PM and will attend the 2:45PM exercise class.  Mailed homework package.  Went over insurance, patient verbalized understanding.

## 2018-11-04 NOTE — Telephone Encounter (Signed)
Attempted to contact patient in regards to Cardiac Rehab - lm on vm °

## 2018-11-04 NOTE — Telephone Encounter (Signed)
Pt's insurance is active and benefits verified through [facility].  $20.00 Co-pay, deductible amount of $0.00/$0.00, out of pocket amount $3,400/$100.00, no co-insurance, and no pre-authorization is required. Passport/reference # - [id number]

## 2018-11-17 ENCOUNTER — Ambulatory Visit: Payer: Medicare Other | Admitting: Interventional Cardiology

## 2018-11-17 ENCOUNTER — Encounter: Payer: Self-pay | Admitting: Interventional Cardiology

## 2018-11-17 VITALS — BP 148/74 | HR 59 | Ht 72.0 in | Wt 219.6 lb

## 2018-11-17 DIAGNOSIS — I10 Essential (primary) hypertension: Secondary | ICD-10-CM | POA: Diagnosis not present

## 2018-11-17 DIAGNOSIS — Z9861 Coronary angioplasty status: Secondary | ICD-10-CM | POA: Diagnosis not present

## 2018-11-17 DIAGNOSIS — E785 Hyperlipidemia, unspecified: Secondary | ICD-10-CM

## 2018-11-17 DIAGNOSIS — I2510 Atherosclerotic heart disease of native coronary artery without angina pectoris: Secondary | ICD-10-CM

## 2018-11-17 LAB — BASIC METABOLIC PANEL
BUN / CREAT RATIO: 15 (ref 10–24)
BUN: 17 mg/dL (ref 8–27)
CHLORIDE: 106 mmol/L (ref 96–106)
CO2: 22 mmol/L (ref 20–29)
Calcium: 9 mg/dL (ref 8.6–10.2)
Creatinine, Ser: 1.13 mg/dL (ref 0.76–1.27)
GFR calc non Af Amer: 65 mL/min/{1.73_m2} (ref >59)
GFR, EST AFRICAN AMERICAN: 76 mL/min/{1.73_m2} (ref >59)
GLUCOSE: 99 mg/dL (ref 65–99)
POTASSIUM: 4.2 mmol/L (ref 3.5–5.2)
Sodium: 139 mmol/L (ref 134–144)

## 2018-11-17 MED ORDER — AMLODIPINE BESYLATE 10 MG PO TABS: 10.0000 mg | ORAL_TABLET | Freq: Every day | ORAL | 3 refills | Status: DC

## 2018-11-17 MED ORDER — CLOPIDOGREL BISULFATE 75 MG PO TABS: 75.0000 mg | ORAL_TABLET | Freq: Every day | ORAL | 3 refills | Status: DC

## 2018-11-17 MED ORDER — ATORVASTATIN CALCIUM 40 MG PO TABS: 40.0000 mg | ORAL_TABLET | Freq: Every day | ORAL | 3 refills | Status: DC

## 2018-11-17 MED ORDER — NITROGLYCERIN 0.4 MG SL SUBL: 0.4000 mg | SUBLINGUAL_TABLET | SUBLINGUAL | 3 refills | Status: DC | PRN

## 2018-11-17 MED ORDER — CARVEDILOL 6.25 MG PO TABS: 6.2500 mg | ORAL_TABLET | Freq: Two times a day (BID) | ORAL | 3 refills | Status: DC

## 2018-11-17 MED ORDER — EZETIMIBE 10 MG PO TABS: 10.0000 mg | ORAL_TABLET | Freq: Every day | ORAL | 3 refills | Status: DC

## 2018-11-17 NOTE — Progress Notes (Signed)
Cardiology Office Note   Date:  11/17/2018   ID:  Matthew Henderson, DOB 1946-05-29, MRN [id number]  PCP:  Asencion Gowda.August Saucer, MD    No chief complaint on file.  CAD  Wt Readings from Last 3 Encounters:  11/17/18 219 lb 9.6 oz (99.6 kg)  11/02/18 215 lb 6.2 oz (97.7 kg)  06/07/13 254 lb (115.2 kg)       History of Present Illness: Matthew Henderson is a 72 y.o. male  With a h/o CAD, recently diagnosed in 11/19.  At that time, Pt endorsed on and off CP, occurring at rest and centrally located and pressure like sensation. He also noted change in exercise tolerance, with more fatigue with activities such as playing golf.   Night of admission, he had developed more severe CP that would not resolve and no improvement with baby ASA.   In ED, EKG showed NSR, HR 83 bpm with no ischemic changes. POC troponin was negative. BP elevated at 179/97. BMP c/w renal insuffiencey w/ SCr at 1.36 (baseline ~1.0). IM admitted pt for further w/u.   Cath in 11/19 showed:  Prox RCA lesion is 80% stenosed.  A drug-eluting stent was successfully placed using a STENT SYNERGY DES 3X24.  Post intervention, there is a 0% residual stenosis.  Dist RCA lesion is 80% stenosed.  A drug-eluting stent was successfully placed using a STENT SYNERGY DES 2.5X20.  Post intervention, there is a 0% residual stenosis.  Mid RCA lesion is 25% stenosed.  Mid LAD lesion is 25% stenosed.  Prox Cx lesion is 25% stenosed.  The left ventricular systolic function is normal.  The left ventricular ejection fraction is 50-55% by visual estimate.  LV end diastolic pressure is normal.  There is no aortic valve stenosis.     Recommend uninterrupted dual antiplatelet therapy with Aspirin 81mg  daily and Clopidogrel 75mg  daily for a minimum of 12 months (ACS - Class I recommendation).   He needs aggressive secondary prevention.  Bradycardia resolved post procedure.  BP stable after fluid bolus when leaving the  cath lab.    Since leaving the hospital, he has done well.    Denies : Chest pain. Dizziness. Leg edema. Nitroglycerin use. Orthopnea. Palpitations. Paroxysmal nocturnal dyspnea. Shortness of breath. Syncope.     Past Medical History:  Diagnosis Date  . CAD S/P percutaneous coronary angioplasty    s/p DES to prox RCA, DES to distal RCA, 11/01/18, residual mild nonobs dz in mid RCA, mid LAD and prox LCx  . Hypercholesteremia   . Hypertension   . Prostate enlargement     Past Surgical History:  Procedure Laterality Date  . CORONARY STENT INTERVENTION N/A 11/01/2018   Procedure: CORONARY STENT INTERVENTION;  Surgeon: Corky Crafts, MD;  Location: [facility] INVASIVE CV LAB;  Service: Cardiovascular;  Laterality: N/A;  . HERNIA REPAIR     OVER 20 YEARS AGO  . LEFT HEART CATH AND CORONARY ANGIOGRAPHY N/A 11/01/2018   Procedure: LEFT HEART CATH AND CORONARY ANGIOGRAPHY;  Surgeon: Corky Crafts, MD;  Location: [facility] INVASIVE CV LAB;  Service: Cardiovascular;  Laterality: N/A;     Current Outpatient Medications  Medication Sig Dispense Refill  . amLODipine (NORVASC) 10 MG tablet Take 1 tablet (10 mg total) by mouth daily. 30 tablet 5  . aspirin EC 81 MG EC tablet Take 1 tablet (81 mg total) by mouth daily. 30 tablet 10  . atorvastatin (LIPITOR) 40 MG tablet Take 1 tablet (40 mg total) by  mouth daily at 6 PM. 30 tablet 5  . carvedilol (COREG) 6.25 MG tablet Take 1 tablet (6.25 mg total) by mouth 2 (two) times daily with a meal. 60 tablet 5  . clopidogrel (PLAVIX) 75 MG tablet Take 1 tablet (75 mg total) by mouth daily with breakfast. 30 tablet 11  . ezetimibe (ZETIA) 10 MG tablet Take 1 tablet (10 mg total) by mouth daily at 6 PM. 30 tablet 5  . finasteride (PROSCAR) 5 MG tablet Take 5 mg by mouth daily.  3  . nitroGLYCERIN (NITROSTAT) 0.4 MG SL tablet Place 1 tablet (0.4 mg total) under the tongue every 5 (five) minutes as needed for chest pain. 25 tablet 3  . Omega-3 Fatty Acids  (FISH OIL PO) Take 2 capsules by mouth daily.     No current facility-administered medications for this visit.     Allergies:   Patient has no known allergies.    Social History:  The patient  reports that he has never smoked. He has never used smokeless tobacco. He reports that he does not drink alcohol or use drugs.   Family History:  The patient's family history includes Cancer in his mother; Heart attack in his father; Heart disease in his brother; Other in his maternal grandmother.    ROS:  Please see the history of present illness.   Otherwise, review of systems are positive for intermittent fatigue.   All other systems are reviewed and negative.    PHYSICAL EXAM: VS:  BP (!) 148/74   Pulse (!) 59   Ht 6' (1.829 m)   Wt 219 lb 9.6 oz (99.6 kg)   SpO2 99%   BMI 29.78 kg/m  , BMI Body mass index is 29.78 kg/m. GEN: Well nourished, well developed, in no acute distress  HEENT: normal  Neck: no JVD, carotid bruits, or masses Cardiac: RRR; no murmurs, rubs, or gallops,no edema  Respiratory:  clear to auscultation bilaterally, normal work of breathing GI: soft, nontender, nondistended, + BS MS: no deformity or atrophy , 2+ right radial pulse; mild bruising Skin: warm and dry, no rash Neuro:  Strength and sensation are intact Psych: euthymic mood, full affect    Recent Labs: 11/02/2018: BUN 13; Creatinine, Ser 1.41; Hemoglobin 13.0; Platelets 156; Potassium 4.2; Sodium 140   Lipid Panel    Component Value Date/Time   CHOL 119 11/02/2018 0348   TRIG 81 11/02/2018 0348   HDL 38 (L) 11/02/2018 0348   CHOLHDL 3.1 11/02/2018 0348   VLDL 16 11/02/2018 0348   LDLCALC 65 11/02/2018 0348     Other studies Reviewed: Additional studies/ records that were reviewed today with results demonstrating: cath results reviewed.   ASSESSMENT AND PLAN:  1. CAD: No angina post PCI.  Continue aggressive prevention.   Continue DAPT.  Spoke about dietary changes. Cardiac rehab abnd  other exercise recommended. 2. Hyperlipidemia: Continue lipid lowering therapy.  Labs controlled in 11/19 3. HTN: The current medical regimen is effective;  continue present plan and medications.  Increase exercise to help as well.  Watch blood pressure at home.  Hopefully, with lifestyle changes, can keep the blood pressure down to avoid having to add additional medication.  Current medicines are reviewed at length with the patient today.  The patient concerns regarding his medicines were addressed.  The following changes have been made:  No change  Labs/ tests ordered today include:  No orders of the defined types were placed in this encounter.   Recommend 150  minutes/week of aerobic exercise Low fat, low carb, high fiber diet recommended  Disposition:   FU in 6 months   Signed, Lance Muss, MD  11/17/2018 9:14 AM    [facility] [address] [place], [place], [place]  [address] Phone: [phone]; Fax: [phone]

## 2018-11-17 NOTE — Patient Instructions (Addendum)
Medication Instructions:  Your physician recommends that you continue on your current medications as directed. Please refer to the Current Medication list given to you today.  If you need a refill on your cardiac medications before your next appointment, please call your pharmacy.   Lab work: TODAY: BMET  If you have labs (blood work) drawn today and your tests are completely normal, you will receive your results only by: . MyChart Message (if you have MyChart) OR . A paper copy in the mail If you have any lab test that is abnormal or we need to change your treatment, we will call you to review the results.  Testing/Procedures: None ordered  Follow-Up: At [facility], you and your health needs are our priority.  As part of our continuing mission to provide you with exceptional heart care, we have created designated Provider Care Teams.  These Care Teams include your primary Cardiologist (physician) and Advanced Practice Providers (APPs -  Physician Assistants and Nurse Practitioners) who all work together to provide you with the care you need, when you need it. . You will need a follow up appointment in 6 months.  Please call our office 2 months in advance to schedule this appointment.  You may see Jay Varanasi, MD or one of the following Advanced Practice Providers on your designated Care Team:   . Brittainy Simmons, PA-C . Dayna Dunn, PA-C . Michele Lenze, PA-C  Any Other Special Instructions Will Be Listed Below (If Applicable).    

## 2018-12-07 ENCOUNTER — Telehealth (HOSPITAL_COMMUNITY): Payer: Self-pay

## 2018-12-14 ENCOUNTER — Other Ambulatory Visit: Payer: Self-pay | Admitting: Family Medicine

## 2018-12-14 DIAGNOSIS — R0989 Other specified symptoms and signs involving the circulatory and respiratory systems: Secondary | ICD-10-CM

## 2018-12-20 ENCOUNTER — Ambulatory Visit
Admission: RE | Admit: 2018-12-20 | Discharge: 2018-12-20 | Disposition: A | Discharge status: Home / Self Care | Payer: Medicare Other | Source: Ambulatory Visit | Attending: Family Medicine | Admitting: Family Medicine

## 2018-12-20 DIAGNOSIS — R0989 Other specified symptoms and signs involving the circulatory and respiratory systems: Secondary | ICD-10-CM

## 2018-12-22 ENCOUNTER — Telehealth (HOSPITAL_COMMUNITY): Payer: Self-pay

## 2018-12-22 NOTE — Telephone Encounter (Signed)
Cardiac Rehab Medication Review by a Pharmacist  Does the patient  feel that his/her medications are working for him/her?  yes  Has the patient been experiencing any side effects to the medications prescribed?  no  Does the patient measure his/her own blood pressure or blood glucose at home?  yes: 130-140/70-80s  Does the patient have any problems obtaining medications due to transportation or finances?   no  Understanding of regimen: good Understanding of indications: good Potential of compliance: good  Pharmacist comments: none  Danae Orleans, PharmD PGY1 Pharmacy Resident Phone [phone] 12/22/2018       1:20 PM

## 2018-12-27 NOTE — Progress Notes (Signed)
DONTAY BURDELL 73 y.o. male DOB Nov 05, 1946 MRN [id number]       Nutrition  No diagnosis found. Past Medical History:  Diagnosis Date  . CAD S/P percutaneous coronary angioplasty    s/p DES to prox RCA, DES to distal RCA, 11/01/18, residual mild nonobs dz in mid RCA, mid LAD and prox LCx  . Hypercholesteremia   . Hypertension   . Prostate enlargement    Meds reviewed.     Current Outpatient Medications (Cardiovascular):  .  amLODipine (NORVASC) 10 MG tablet, Take 1 tablet (10 mg total) by mouth daily. [name]  atorvastatin (LIPITOR) 40 MG tablet, Take 1 tablet (40 mg total) by mouth daily at 6 PM. .  carvedilol (COREG) 6.25 MG tablet, Take 1 tablet (6.25 mg total) by mouth 2 (two) times daily with a meal. .  ezetimibe (ZETIA) 10 MG tablet, Take 1 tablet (10 mg total) by mouth daily at 6 PM. .  nitroGLYCERIN (NITROSTAT) 0.4 MG SL tablet, Place 1 tablet (0.4 mg total) under the tongue every 5 (five) minutes as needed for chest pain. (Patient not taking: Reported on 12/22/2018)   Current Outpatient Medications (Analgesics):  .  aspirin EC 81 MG EC tablet, Take 1 tablet (81 mg total) by mouth daily.  Current Outpatient Medications (Hematological):  .  clopidogrel (PLAVIX) 75 MG tablet, Take 1 tablet (75 mg total) by mouth daily with breakfast.  Current Outpatient Medications (Other):  .  finasteride (PROSCAR) 5 MG tablet, Take 5 mg by mouth daily. .  Omega-3 Fatty Acids (FISH OIL PO), Take 2 capsules by mouth daily.   HT: Ht Readings from Last 1 Encounters:  11/17/18 6' (1.829 m)    WT: Wt Readings from Last 5 Encounters:  11/17/18 219 lb 9.6 oz (99.6 kg)  11/02/18 215 lb 6.2 oz (97.7 kg)  06/07/13 254 lb (115.2 kg)     BMI =  29.78     11/17/18  Current tobacco use? No       Labs:  Lipid Panel     Component Value Date/Time   CHOL 119 11/02/2018 0348   TRIG 81 11/02/2018 0348   HDL 38 (L) 11/02/2018 0348   CHOLHDL 3.1 11/02/2018 0348   VLDL 16 11/02/2018 0348   LDLCALC  65 11/02/2018 0348    No results found for: HGBA1C CBG (last 3)  No results for input(s): GLUCAP in the last 72 hours.  Nutrition Diagnosis ? Food-and nutrition-related knowledge deficit related to lack of exposure to information as related to diagnosis of: ? CVD  Nutrition Goal(s):  ? To be determined  Plan:  Pt to attend nutrition classes ? Nutrition I ? Nutrition II ? Portion Distortion  ? Diabetes Blitz ? Diabetes Q & A Will provide client-centered nutrition education as part of interdisciplinary care.   Monitor and evaluate progress toward nutrition goal with team.  Ross Marcus, MS, RD, LDN 12/27/2018 2:38 PM

## 2018-12-30 ENCOUNTER — Encounter (HOSPITAL_COMMUNITY): Payer: Self-pay

## 2018-12-30 ENCOUNTER — Encounter: Payer: Self-pay | Admitting: Interventional Cardiology

## 2018-12-30 ENCOUNTER — Encounter (HOSPITAL_COMMUNITY)
Admission: RE | Admit: 2018-12-30 | Discharge: 2018-12-30 | Disposition: A | Discharge status: Home / Self Care | Payer: Medicare Other | Source: Ambulatory Visit | Attending: Interventional Cardiology | Admitting: Interventional Cardiology

## 2018-12-30 ENCOUNTER — Telehealth: Payer: Self-pay | Admitting: Interventional Cardiology

## 2018-12-30 VITALS — BP 124/62 | HR 75 | Ht 70.5 in | Wt 220.0 lb

## 2018-12-30 DIAGNOSIS — Z955 Presence of coronary angioplasty implant and graft: Secondary | ICD-10-CM | POA: Insufficient documentation

## 2018-12-30 NOTE — Progress Notes (Signed)
Cardiac Individual Treatment Plan  Patient Details  Name: Matthew Henderson MRN: [id number] Date of Birth: [birth year] Referring Provider:     CARDIAC REHAB PHASE II ORIENTATION from 12/30/2018 in [facility]ARD[facility]  Referring Provider  Dr.Varanasi      Initial Encounter Date:    CARDIAC REHAB PHASE II ORIENTATION from 12/30/2018 in [facility] C[facility]HAB  Date  12/30/18      Visit Diagnosis: Status post coronary artery stent placement,11/01/18 DES RCA  Patient's Home Medications on Admission:  Current Outpatient Medications:  .  amLODipine (NORVASC) 10 MG tablet, Take 1 tablet (10 mg total) by mouth daily., Disp: 90 tablet, Rfl: 3 .  aspirin EC 81 MG EC tablet, Take 1 tablet (81 mg total) by mouth daily., Disp: 30 tablet, Rfl: 10 .  atorvastatin (LIPITOR) 40 MG tablet, Take 1 tablet (40 mg total) by mouth daily at 6 PM., Disp: 90 tablet, Rfl: 3 .  carvedilol (COREG) 6.25 MG tablet, Take 1 tablet (6.25 mg total) by mouth 2 (two) times daily with a meal., Disp: 180 tablet, Rfl: 3 .  clopidogrel (PLAVIX) 75 MG tablet, Take 1 tablet (75 mg total) by mouth daily with breakfast., Disp: 90 tablet, Rfl: 3 .  ezetimibe (ZETIA) 10 MG tablet, Take 1 tablet (10 mg total) by mouth daily at 6 PM., Disp: 90 tablet, Rfl: 3 .  finasteride (PROSCAR) 5 MG tablet, Take 5 mg by mouth daily., Disp: , Rfl: 3 .  nitroGLYCERIN (NITROSTAT) 0.4 MG SL tablet, Place 1 tablet (0.4 mg total) under the tongue every 5 (five) minutes as needed for chest pain. (Patient not taking: Reported on 12/22/2018), Disp: 25 tablet, Rfl: 3 .  Omega-3 Fatty Acids (FISH OIL PO), Take 2 capsules by mouth daily., Disp: , Rfl:   Past Medical History: Past Medical History:  Diagnosis Date  . CAD S/P percutaneous coronary angioplasty    s/p DES to prox RCA, DES to distal RCA, 11/01/18, residual mild nonobs dz in mid RCA, mid LAD and prox LCx  . Hypercholesteremia   . Hypertension   . Prostate  enlargement     Tobacco Use: Social History   Tobacco Use  Smoking Status Never Smoker  Smokeless Tobacco Never Used    Labs: Recent Review Advice worker    Labs for ITP Cardiac and Pulmonary Rehab Latest Ref Rng & Units 06/07/2013 11/02/2018   Cholestrol 0 - 200 mg/dL - 409   LDLCALC 0 - 99 mg/dL - 65   HDL >81 mg/dL - 19(J)   Trlycerides <478 mg/dL - 81   TCO2 0 - 295 mmol/L 22 -      Capillary Blood Glucose: No results found for: GLUCAP   Exercise Target Goals: Exercise Program Goal: Individual exercise prescription set using results from initial 6 min walk test and THRR while considering  patient's activity barriers and safety.   Exercise Prescription Goal: Initial exercise prescription builds to 30-45 minutes a day of aerobic activity, 2-3 days per week.  Home exercise guidelines will be given to patient during program as part of exercise prescription that the participant will acknowledge.  Activity Barriers & Risk Stratification: Activity Barriers & Cardiac Risk Stratification - 12/30/18 1454      Activity Barriers & Cardiac Risk Stratification   Activity Barriers  None    Cardiac Risk Stratification  Moderate       6 Minute Walk: 6 Minute Walk    Row Name 12/30/18 1453  6 Minute Walk   Phase  Initial     Distance  1612 feet     Walk Time  6 minutes     # of Rest Breaks  0     MPH  3.1     METS  3.42     RPE  11     VO2 Peak  11.96     Symptoms  No     Resting HR  75 bpm     Resting BP  124/62     Resting Oxygen Saturation   98 %     Exercise Oxygen Saturation  during 6 min walk  98 %     Max Ex. HR  116 bpm     Max Ex. BP  130/68     2 Minute Post BP  118/72        Oxygen Initial Assessment:   Oxygen Re-Evaluation:   Oxygen Discharge (Final Oxygen Re-Evaluation):   Initial Exercise Prescription: Initial Exercise Prescription - 12/30/18 1400      Date of Initial Exercise RX and Referring Provider   Date  12/30/18     Referring Provider  Dr.Varanasi    Expected Discharge Date  04/08/19      Bike   Level  1    Minutes  10    METs  2.89      NuStep   Level  3    SPM  85    Minutes  10    METs  3      Track   Laps  11    Minutes  10    METs  2.91      Prescription Details   Frequency (times per week)  3    Duration  Progress to 30 minutes of continuous aerobic without signs/symptoms of physical distress      Intensity   THRR 40-80% of Max Heartrate  60-120    Ratings of Perceived Exertion  11-13      Progression   Progression  Continue to progress workloads to maintain intensity without signs/symptoms of physical distress.      Resistance Training   Training Prescription  Yes    Weight  3 lbs.     Reps  10-15       Perform Capillary Blood Glucose checks as needed.  Exercise Prescription Changes:   Exercise Comments:   Exercise Goals and Review: Exercise Goals    Row Name 12/30/18 1408             Exercise Goals   Increase Physical Activity  Yes       Intervention  Provide advice, education, support and counseling about physical activity/exercise needs.;Develop an individualized exercise prescription for aerobic and resistive training based on initial evaluation findings, risk stratification, comorbidities and participant's personal goals.       Expected Outcomes  Short Term: Attend rehab on a regular basis to increase amount of physical activity.;Long Term: Exercising regularly at least 3-5 days a week.;Long Term: Add in home exercise to make exercise part of routine and to increase amount of physical activity.       Increase Strength and Stamina  Yes       Intervention  Provide advice, education, support and counseling about physical activity/exercise needs.;Develop an individualized exercise prescription for aerobic and resistive training based on initial evaluation findings, risk stratification, comorbidities and participant's personal goals.       Expected Outcomes   Short Term: Increase workloads  from initial exercise prescription for resistance, speed, and METs.;Short Term: Perform resistance training exercises routinely during rehab and add in resistance training at home;Long Term: Improve cardiorespiratory fitness, muscular endurance and strength as measured by increased METs and functional capacity (6MWT)       Able to understand and use rate of perceived exertion (RPE) scale  Yes       Intervention  Provide education and explanation on how to use RPE scale       Expected Outcomes  Short Term: Able to use RPE daily in rehab to express subjective intensity level;Long Term:  Able to use RPE to guide intensity level when exercising independently       Knowledge and understanding of Target Heart Rate Range (THRR)  Yes       Intervention  Provide education and explanation of THRR including how the numbers were predicted and where they are located for reference       Expected Outcomes  Short Term: Able to state/look up THRR;Long Term: Able to use THRR to govern intensity when exercising independently;Short Term: Able to use daily as guideline for intensity in rehab       Able to check pulse independently  Yes       Intervention  Provide education and demonstration on how to check pulse in carotid and radial arteries.;Review the importance of being able to check your own pulse for safety during independent exercise       Expected Outcomes  Short Term: Able to explain why pulse checking is important during independent exercise;Long Term: Able to check pulse independently and accurately       Understanding of Exercise Prescription  Yes       Intervention  Provide education, explanation, and written materials on patient's individual exercise prescription       Expected Outcomes  Short Term: Able to explain program exercise prescription;Long Term: Able to explain home exercise prescription to exercise independently          Exercise Goals Re-Evaluation :   Discharge  Exercise Prescription (Final Exercise Prescription Changes):   Nutrition:  Target Goals: Understanding of nutrition guidelines, daily intake of sodium 1500mg , cholesterol 200mg , calories 30% from fat and 7% or less from saturated fats, daily to have 5 or more servings of fruits and vegetables.  Biometrics: Pre Biometrics - 12/30/18 1455      Pre Biometrics   Height  5' 10.5" (1.791 m)    Weight  99.8 kg    Waist Circumference  45 inches    Hip Circumference  41.5 inches    Waist to Hip Ratio  1.08 %    BMI (Calculated)  31.11    Triceps Skinfold  30 mm    % Body Fat  33.9 %    Grip Strength  37 kg    Flexibility  9 in    Single Leg Stand  19.87 seconds        Nutrition Therapy Plan and Nutrition Goals:   Nutrition Assessments:   Nutrition Goals Re-Evaluation:   Nutrition Goals Re-Evaluation:   Nutrition Goals Discharge (Final Nutrition Goals Re-Evaluation):   Psychosocial: Target Goals: Acknowledge presence or absence of significant depression and/or stress, maximize coping skills, provide positive support system. Participant is able to verbalize types and ability to use techniques and skills needed for reducing stress and depression.  Initial Review & Psychosocial Screening: Initial Psych Review & Screening - 12/30/18 1534      Initial Review   Current issues  with  None Identified      Family Dynamics   Good Support System?  Yes   Matthew Henderson has his wife for support     Barriers   Psychosocial barriers to participate in program  There are no identifiable barriers or psychosocial needs.      Screening Interventions   Interventions  Encouraged to exercise       Quality of Life Scores: Quality of Life - 12/30/18 1457      Quality of Life   Select  Quality of Life      Quality of Life Scores   Health/Function Pre  28.8 %    Socioeconomic Pre  30 %    Psych/Spiritual Pre  30 %    Family Pre  28.5 %    GLOBAL Pre  29.23 %      Scores of 19 and  below usually indicate a poorer quality of life in these areas.  A difference of  2-3 points is a clinically meaningful difference.  A difference of 2-3 points in the total score of the Quality of Life Index has been associated with significant improvement in overall quality of life, self-image, physical symptoms, and general health in studies assessing change in quality of life.  PHQ-9: Recent Review Flowsheet Data    There is no flowsheet data to display.     Interpretation of Total Score  Total Score Depression Severity:  1-4 = Minimal depression, 5-9 = Mild depression, 10-14 = Moderate depression, 15-19 = Moderately severe depression, 20-27 = Severe depression   Psychosocial Evaluation and Intervention:   Psychosocial Re-Evaluation:   Psychosocial Discharge (Final Psychosocial Re-Evaluation):   Vocational Rehabilitation: Provide vocational rehab assistance to qualifying candidates.   Vocational Rehab Evaluation & Intervention: Vocational Rehab - 12/30/18 1536      Initial Vocational Rehab Evaluation & Intervention   Assessment shows need for Vocational Rehabilitation  No   Matthew Henderson is retired and does not need vocational rehab at this time       Education: Education Goals: Education classes will be provided on a weekly basis, covering required topics. Participant will state understanding/return demonstration of topics presented.  Learning Barriers/Preferences: Learning Barriers/Preferences - 12/30/18 1458      Learning Barriers/Preferences   Learning Barriers  None    Learning Preferences  Written Material;Video;Pictoral;Skilled Demonstration       Education Topics: Count Your Pulse:  -Group instruction provided by verbal instruction, demonstration, patient participation and written materials to support subject.  Instructors address importance of being able to find your pulse and how to count your pulse when at home without a heart monitor.  Patients get hands on  experience counting their pulse with staff help and individually.   Heart Attack, Angina, and Risk Factor Modification:  -Group instruction provided by verbal instruction, video, and written materials to support subject.  Instructors address signs and symptoms of angina and heart attacks.    Also discuss risk factors for heart disease and how to make changes to improve heart health risk factors.   Functional Fitness:  -Group instruction provided by verbal instruction, demonstration, patient participation, and written materials to support subject.  Instructors address safety measures for doing things around the house.  Discuss how to get up and down off the floor, how to pick things up properly, how to safely get out of a chair without assistance, and balance training.   Meditation and Mindfulness:  -Group instruction provided by verbal instruction, patient participation, and written materials  to support subject.  Instructor addresses importance of mindfulness and meditation practice to help reduce stress and improve awareness.  Instructor also leads participants through a meditation exercise.    Stretching for Flexibility and Mobility:  -Group instruction provided by verbal instruction, patient participation, and written materials to support subject.  Instructors lead participants through series of stretches that are designed to increase flexibility thus improving mobility.  These stretches are additional exercise for major muscle groups that are typically performed during regular warm up and cool down.   Hands Only CPR:  -Group verbal, video, and participation provides a basic overview of AHA guidelines for community CPR. Role-play of emergencies allow participants the opportunity to practice calling for help and chest compression technique with discussion of AED use.   Hypertension: -Group verbal and written instruction that provides a basic overview of hypertension including the most  recent diagnostic guidelines, risk factor reduction with self-care instructions and medication management.    Nutrition I class: Heart Healthy Eating:  -Group instruction provided by PowerPoint slides, verbal discussion, and written materials to support subject matter. The instructor gives an explanation and review of the Therapeutic Lifestyle Changes diet recommendations, which includes a discussion on lipid goals, dietary fat, sodium, fiber, plant stanol/sterol esters, sugar, and the components of a well-balanced, healthy diet.   Nutrition II class: Lifestyle Skills:  -Group instruction provided by PowerPoint slides, verbal discussion, and written materials to support subject matter. The instructor gives an explanation and review of label reading, grocery shopping for heart health, heart healthy recipe modifications, and ways to make healthier choices when eating out.   Diabetes Question & Answer:  -Group instruction provided by PowerPoint slides, verbal discussion, and written materials to support subject matter. The instructor gives an explanation and review of diabetes co-morbidities, pre- and post-prandial blood glucose goals, pre-exercise blood glucose goals, signs, symptoms, and treatment of hypoglycemia and hyperglycemia, and foot care basics.   Diabetes Blitz:  -Group instruction provided by PowerPoint slides, verbal discussion, and written materials to support subject matter. The instructor gives an explanation and review of the physiology behind type 1 and type 2 diabetes, diabetes medications and rational behind using different medications, pre- and post-prandial blood glucose recommendations and Hemoglobin A1c goals, diabetes diet, and exercise including blood glucose guidelines for exercising safely.    Portion Distortion:  -Group instruction provided by PowerPoint slides, verbal discussion, written materials, and food models to support subject matter. The instructor gives an  explanation of serving size versus portion size, changes in portions sizes over the last 20 years, and what consists of a serving from each food group.   Stress Management:  -Group instruction provided by verbal instruction, video, and written materials to support subject matter.  Instructors review role of stress in heart disease and how to cope with stress positively.     Exercising on Your Own:  -Group instruction provided by verbal instruction, power point, and written materials to support subject.  Instructors discuss benefits of exercise, components of exercise, frequency and intensity of exercise, and end points for exercise.  Also discuss use of nitroglycerin and activating EMS.  Review options of places to exercise outside of rehab.  Review guidelines for sex with heart disease.   Cardiac Drugs I:  -Group instruction provided by verbal instruction and written materials to support subject.  Instructor reviews cardiac drug classes: antiplatelets, anticoagulants, beta blockers, and statins.  Instructor discusses reasons, side effects, and lifestyle considerations for each drug class.   Cardiac Drugs  II:  -Group instruction provided by verbal instruction and written materials to support subject.  Instructor reviews cardiac drug classes: angiotensin converting enzyme inhibitors (ACE-I), angiotensin II receptor blockers (ARBs), nitrates, and calcium channel blockers.  Instructor discusses reasons, side effects, and lifestyle considerations for each drug class.   Anatomy and Physiology of the Circulatory System:  Group verbal and written instruction and models provide basic cardiac anatomy and physiology, with the coronary electrical and arterial systems. Review of: AMI, Angina, Valve disease, Heart Failure, Peripheral Artery Disease, Cardiac Arrhythmia, Pacemakers, and the ICD.   Other Education:  -Group or individual verbal, written, or video instructions that support the educational  goals of the cardiac rehab program.   Holiday Eating Survival Tips:  -Group instruction provided by PowerPoint slides, verbal discussion, and written materials to support subject matter. The instructor gives patients tips, tricks, and techniques to help them not only survive but enjoy the holidays despite the onslaught of food that accompanies the holidays.   Knowledge Questionnaire Score: Knowledge Questionnaire Score - 12/30/18 1404      Knowledge Questionnaire Score   Pre Score  20/24       Core Components/Risk Factors/Patient Goals at Admission: Personal Goals and Risk Factors at Admission - 12/30/18 1459      Core Components/Risk Factors/Patient Goals on Admission    Weight Management  Yes;Obesity;Weight Maintenance;Weight Loss    Admit Weight  220 lb 0.3 oz (99.8 kg)    Expected Outcomes  Short Term: Continue to assess and modify interventions until short term weight is achieved;Long Term: Adherence to nutrition and physical activity/exercise program aimed toward attainment of established weight goal;Weight Maintenance: Understanding of the daily nutrition guidelines, which includes 25-35% calories from fat, 7% or less cal from saturated fats, less than 200mg  cholesterol, less than 1.5gm of sodium, & 5 or more servings of fruits and vegetables daily;Weight Loss: Understanding of general recommendations for a balanced deficit meal plan, which promotes 1-2 lb weight loss per week and includes a negative energy balance of [id number] kcal/d;Understanding recommendations for meals to include 15-35% energy as protein, 25-35% energy from fat, 35-60% energy from carbohydrates, less than 200mg  of dietary cholesterol, 20-35 gm of total fiber daily;Understanding of distribution of calorie intake throughout the day with the consumption of 4-5 meals/snacks    Hypertension  Yes    Intervention  Provide education on lifestyle modifcations including regular physical activity/exercise, weight management,  moderate sodium restriction and increased consumption of fresh fruit, vegetables, and low fat dairy, alcohol moderation, and smoking cessation.;Monitor prescription use compliance.    Expected Outcomes  Short Term: Continued assessment and intervention until BP is < 140/5390mm HG in hypertensive participants. < 130/3580mm HG in hypertensive participants with diabetes, heart failure or chronic kidney disease.;Long Term: Maintenance of blood pressure at goal levels.    Lipids  Yes    Intervention  Provide education and support for participant on nutrition & aerobic/resistive exercise along with prescribed medications to achieve LDL 70mg , HDL >40mg .    Expected Outcomes  Short Term: Participant states understanding of desired cholesterol values and is compliant with medications prescribed. Participant is following exercise prescription and nutrition guidelines.;Long Term: Cholesterol controlled with medications as prescribed, with individualized exercise RX and with personalized nutrition plan. Value goals: LDL < 70mg , HDL > 40 mg.    Stress  Yes    Intervention  Offer individual and/or small group education and counseling on adjustment to heart disease, stress management and health-related lifestyle change. Teach and support self-help  strategies.;Refer participants experiencing significant psychosocial distress to appropriate mental health specialists for further evaluation and treatment. When possible, include family members and significant others in education/counseling sessions.    Expected Outcomes  Short Term: Participant demonstrates changes in health-related behavior, relaxation and other stress management skills, ability to obtain effective social support, and compliance with psychotropic medications if prescribed.;Long Term: Emotional wellbeing is indicated by absence of clinically significant psychosocial distress or social isolation.       Core Components/Risk Factors/Patient Goals Review:     Core Components/Risk Factors/Patient Goals at Discharge (Final Review):    ITP Comments: ITP Comments    Row Name 12/30/18 1404           ITP Comments  Dr. Armanda Magic, Mediacl Director          Comments: Casimiro Needle attended orientation from 1337 to 1450 to review rules and guidelines for program. Completed 6 minute walk test, Intitial ITP, and exercise prescription.  VSS. Telemetry-Sinus Rhythm with frequent PAC's, rare PAC.  Asymptomatic.Gladstone Lighter, RN,BSN 12/30/2018 3:42 PM

## 2018-12-30 NOTE — Progress Notes (Signed)
RILAN HONECK 73 y.o. male DOB: 08/17/1946 MRN: [id number]      Nutrition Note  1. Status post coronary artery stent placement,11/01/18 DES RCA    Past Medical History:  Diagnosis Date  . CAD S/P percutaneous coronary angioplasty    s/p DES to prox RCA, DES to distal RCA, 11/01/18, residual mild nonobs dz in mid RCA, mid LAD and prox LCx  . Hypercholesteremia   . Hypertension   . Prostate enlargement    Meds reviewed.    Current Outpatient Medications (Cardiovascular):  .  amLODipine (NORVASC) 10 MG tablet, Take 1 tablet (10 mg total) by mouth daily. [name]  atorvastatin (LIPITOR) 40 MG tablet, Take 1 tablet (40 mg total) by mouth daily at 6 PM. .  carvedilol (COREG) 6.25 MG tablet, Take 1 tablet (6.25 mg total) by mouth 2 (two) times daily with a meal. .  ezetimibe (ZETIA) 10 MG tablet, Take 1 tablet (10 mg total) by mouth daily at 6 PM. .  nitroGLYCERIN (NITROSTAT) 0.4 MG SL tablet, Place 1 tablet (0.4 mg total) under the tongue every 5 (five) minutes as needed for chest pain. (Patient not taking: Reported on 12/22/2018)   Current Outpatient Medications (Analgesics):  .  aspirin EC 81 MG EC tablet, Take 1 tablet (81 mg total) by mouth daily.  Current Outpatient Medications (Hematological):  .  clopidogrel (PLAVIX) 75 MG tablet, Take 1 tablet (75 mg total) by mouth daily with breakfast.  Current Outpatient Medications (Other):  .  finasteride (PROSCAR) 5 MG tablet, Take 5 mg by mouth daily. .  Omega-3 Fatty Acids (FISH OIL PO), Take 2 capsules by mouth daily.   HT: Ht Readings from Last 1 Encounters:  12/30/18 5' 10.5" (1.791 m)    WT: Wt Readings from Last 5 Encounters:  12/30/18 220 lb 0.3 oz (99.8 kg)  11/17/18 219 lb 9.6 oz (99.6 kg)  11/02/18 215 lb 6.2 oz (97.7 kg)  06/07/13 254 lb (115.2 kg)     Body mass index is 31.12 kg/m.   Current tobacco use? No  Labs:  Lipid Panel     Component Value Date/Time   CHOL 119 11/02/2018 0348   TRIG 81 11/02/2018 0348   HDL 38 (L) 11/02/2018 0348   CHOLHDL 3.1 11/02/2018 0348   VLDL 16 11/02/2018 0348   LDLCALC 65 11/02/2018 0348    No results found for: HGBA1C CBG (last 3)  No results for input(s): GLUCAP in the last 72 hours.  Nutrition Note Spoke with pt. Nutrition plan and goals reviewed with pt. Pt is following Step 2 of the Therapeutic Lifestyle Changes diet. Pt wants to lose wt. Pt has not actively been trying to lose wt. Wt loss tips reviewed (label reading, how to build a healthy plate, portion sizes, eating frequently across the day). Per discussion, pt does not use canned/convenience foods often. Pt does not add salt to food. Pt does not eat out frequently. Pt expressed understanding of the information reviewed. Pt aware of nutrition education classes offered and would like to attend nutrition classes.  Nutrition Diagnosis ? Food-and nutrition-related knowledge deficit related to lack of exposure to information as related to diagnosis of: ? CVD ? Obese  I = 30-34.9 related to excessive energy intake as evidenced by a Body mass index is 31.12 kg/m.  Nutrition Intervention ? Pt's individual nutrition plan and goals reviewed with pt. ? Pt given handouts for: ? Nutrition I class ? Nutrition II class   Nutrition Goal(s):  ? Pt  to identify and limit food sources of saturated fat, trans fat, refined carbohydrates and sodium ? Pt to identify food quantities necessary to achieve weight loss of 6-24 lbs. at graduation from cardiac rehab.   Plan:  ? Pt to attend nutrition classes ? Nutrition I ? Nutrition II ? Portion Distortion  ? Will provide client-centered nutrition education as part of interdisciplinary care ? Monitor and evaluate progress toward nutrition goal with team.   Ross Marcus, MS, RD, LDN 12/30/2018 3:49 PM

## 2018-12-30 NOTE — Progress Notes (Signed)
Matthew Henderson is here this afternoon for orientation patient noted to having frequent PAC's. Patient asymptomatic. Vital signs stable. 12 lead ECG shows normal sinus rhythm. Patient did have some PAC's during his hospitalization in November. Dr Hoyle Barr office called and notified.Will fax exercise flow sheets to Dr. Maylon Cos office for review.Gladstone Lighter, RN,BSN 12/30/2018 2:18 PM

## 2018-12-30 NOTE — Telephone Encounter (Signed)
Matthew Henderson from Cardiac Rehab called to let Dr. Eldridge Dace know pt in for orientation today and she noticed frequent PACs when he was in the hospital.  None noted today at Cardiac Rehab.  She is sending over readings and vitals from Cardiac Rehab today for Dr. Eldridge Dace to review.

## 2019-01-03 ENCOUNTER — Ambulatory Visit (HOSPITAL_COMMUNITY): Payer: BC Managed Care – PPO

## 2019-01-03 ENCOUNTER — Encounter (HOSPITAL_COMMUNITY)
Admission: RE | Admit: 2019-01-03 | Discharge: 2019-01-03 | Disposition: A | Discharge status: Home / Self Care | Payer: Medicare Other | Source: Ambulatory Visit | Attending: Interventional Cardiology | Admitting: Interventional Cardiology

## 2019-01-03 DIAGNOSIS — Z955 Presence of coronary angioplasty implant and graft: Secondary | ICD-10-CM

## 2019-01-03 NOTE — Progress Notes (Signed)
Daily Session Note  Patient Details  Name: Matthew Henderson MRN: [id number] Date of Birth: Sep 01, 1946 Referring Provider:     Victor from 12/30/2018 in [place]  Referring Provider  Dr.Varanasi      Encounter Date: 01/03/2019  Check In: Session Check In - 01/03/19 1510      Check-In   Supervising physician immediately available to respond to emergencies  [facility] immediately available    Physician(s)  Dr. Tana Coast    Location  [facility]    Staff Present  Deitra Mayo, BS, ACSM CEP, Exercise Physiologist;Maria Whitaker, RN, BSN;Joann Rion, RN, BSN;Tyara Nevels, MS,ACSM CEP, Exercise Physiologist    Medication changes reported      No    Fall or balance concerns reported     No    Tobacco Cessation  No Change    Warm-up and Cool-down  Performed as group-led instruction    Resistance Training Performed  Yes    VAD Patient?  No    PAD/SET Patient?  No      Pain Assessment   Currently in Pain?  No/denies       Capillary Blood Glucose: No results found for this or any previous visit (from the past 24 hour(s)).    Social History   Tobacco Use  Smoking Status Never Smoker  Smokeless Tobacco Never Used    Goals Met:  Exercise tolerated well  Goals Unmet:  Not Applicable  Comments: Matthew Henderson started cardiac rehab today.  Pt tolerated light exercise without difficulty. VSS, telemetry-Sinus Rhythm, asymptomatic.  Medication list reconciled. Pt denies barriers to medicaiton compliance.  PSYCHOSOCIAL ASSESSMENT:  PHQ-0. Pt exhibits positive coping skills, hopeful outlook with supportive family. No psychosocial needs identified at this time, no psychosocial interventions necessary.    Pt enjoys playing golf.   Pt oriented to exercise equipment and routine.    Understanding verbalized.Matthew Pall, RN,BSN 01/03/2019 4:39 PM   Dr. Fransico Him is Medical Director for Cardiac Rehab at [facility].

## 2019-01-05 ENCOUNTER — Encounter (HOSPITAL_COMMUNITY)
Admission: RE | Admit: 2019-01-05 | Discharge: 2019-01-05 | Disposition: A | Discharge status: Home / Self Care | Payer: Medicare Other | Source: Ambulatory Visit | Attending: Interventional Cardiology | Admitting: Interventional Cardiology

## 2019-01-05 ENCOUNTER — Ambulatory Visit (HOSPITAL_COMMUNITY): Payer: BC Managed Care – PPO

## 2019-01-05 DIAGNOSIS — Z955 Presence of coronary angioplasty implant and graft: Secondary | ICD-10-CM | POA: Diagnosis not present

## 2019-01-06 NOTE — Telephone Encounter (Signed)
Dr. Eldridge Dace reviewed fax from Cardiac Rehab. Strips showed rare PACs/PVCs. No changes needed at this time.

## 2019-01-07 ENCOUNTER — Encounter (HOSPITAL_COMMUNITY)
Admission: RE | Admit: 2019-01-07 | Discharge: 2019-01-07 | Disposition: A | Discharge status: Home / Self Care | Payer: Medicare Other | Source: Ambulatory Visit | Attending: Interventional Cardiology | Admitting: Interventional Cardiology

## 2019-01-07 ENCOUNTER — Ambulatory Visit (HOSPITAL_COMMUNITY): Payer: BC Managed Care – PPO

## 2019-01-07 DIAGNOSIS — Z955 Presence of coronary angioplasty implant and graft: Secondary | ICD-10-CM

## 2019-01-10 ENCOUNTER — Ambulatory Visit (HOSPITAL_COMMUNITY): Payer: BC Managed Care – PPO

## 2019-01-10 ENCOUNTER — Encounter (HOSPITAL_COMMUNITY)
Admission: RE | Admit: 2019-01-10 | Discharge: 2019-01-10 | Disposition: A | Discharge status: Home / Self Care | Payer: Medicare Other | Source: Ambulatory Visit | Attending: Interventional Cardiology | Admitting: Interventional Cardiology

## 2019-01-10 DIAGNOSIS — Z955 Presence of coronary angioplasty implant and graft: Secondary | ICD-10-CM | POA: Diagnosis not present

## 2019-01-12 ENCOUNTER — Encounter (HOSPITAL_COMMUNITY)
Admission: RE | Admit: 2019-01-12 | Discharge: 2019-01-12 | Disposition: A | Discharge status: Home / Self Care | Payer: Medicare Other | Source: Ambulatory Visit | Attending: Interventional Cardiology | Admitting: Interventional Cardiology

## 2019-01-12 ENCOUNTER — Ambulatory Visit (HOSPITAL_COMMUNITY): Payer: BC Managed Care – PPO

## 2019-01-12 DIAGNOSIS — Z955 Presence of coronary angioplasty implant and graft: Secondary | ICD-10-CM | POA: Diagnosis not present

## 2019-01-12 NOTE — Progress Notes (Signed)
Matthew Henderson 73 y.o. male Nutrition Note Spoke with pt. Nutrition Plan and Nutrition Survey goals reviewed with pt. Pt is following a Heart Healthy diet. Pt wants to lose wt. Pt has been trying to lose wt by following a heart healthy diet. Additional wt loss tips reviewed with pt today (label reading, how to build a healthy plate, portion sizes, eating frequently across the day). Per discussion, pt does not use canned/convenience foods often. Pt does not add salt to food. Pt does not eat out frequently. Pt expressed understanding of the information reviewed. Pt aware of nutrition education classes offered and does plan on attending nutrition classes.  No results found for: HGBA1C  Wt Readings from Last 3 Encounters:  12/30/18 220 lb 0.3 oz (99.8 kg)  11/17/18 219 lb 9.6 oz (99.6 kg)  11/02/18 215 lb 6.2 oz (97.7 kg)    Nutrition Diagnosis ? Food-and nutrition-related knowledge deficit related to lack of exposure to information as related to diagnosis of: ? CVD   Nutrition Intervention ? Pt's individual nutrition plan reviewed with pt. ? Benefits of adopting Heart Healthy diet discussed when Med[employer] reviewed.    Goal(s) ? Pt to identify and limit food sources of saturated fat, trans fat, refined carbohydrates and sodium ? Pt to identify food quantities necessary to achieve weight loss of 6-24 lb at graduation from cardiac rehab.   Plan:   Pt to attend nutrition classes ? Nutrition I ? Nutrition II ? Portion Distortion   Will provide client-centered nutrition education as part of interdisciplinary care  Monitor and evaluate progress toward nutrition goal with team.  Ross Marcus, MS, RD, LDN 01/12/2019 3:34 PM

## 2019-01-14 ENCOUNTER — Encounter (HOSPITAL_COMMUNITY)
Admission: RE | Admit: 2019-01-14 | Discharge: 2019-01-14 | Disposition: A | Discharge status: Home / Self Care | Payer: Medicare Other | Source: Ambulatory Visit | Attending: Interventional Cardiology | Admitting: Interventional Cardiology

## 2019-01-14 ENCOUNTER — Ambulatory Visit (HOSPITAL_COMMUNITY): Payer: BC Managed Care – PPO

## 2019-01-14 DIAGNOSIS — Z955 Presence of coronary angioplasty implant and graft: Secondary | ICD-10-CM | POA: Diagnosis not present

## 2019-01-14 NOTE — Progress Notes (Signed)
Reviewed home exercise guidelines with patient including endpoints, temperature precautions, target heart rate and rate of perceived exertion. Pt plans to walk as his mode of home exercise. Pt voices understanding of instructions given.   Matthew Henderson M Randie Tallarico, MS, ACSM CEP  

## 2019-01-17 ENCOUNTER — Ambulatory Visit (HOSPITAL_COMMUNITY): Payer: BC Managed Care – PPO

## 2019-01-17 ENCOUNTER — Encounter (HOSPITAL_COMMUNITY)
Admission: RE | Admit: 2019-01-17 | Discharge: 2019-01-17 | Disposition: A | Discharge status: Home / Self Care | Payer: Medicare Other | Source: Ambulatory Visit | Attending: Interventional Cardiology | Admitting: Interventional Cardiology

## 2019-01-17 DIAGNOSIS — Z955 Presence of coronary angioplasty implant and graft: Secondary | ICD-10-CM | POA: Insufficient documentation

## 2019-01-19 ENCOUNTER — Ambulatory Visit (HOSPITAL_COMMUNITY): Payer: BC Managed Care – PPO

## 2019-01-19 ENCOUNTER — Encounter (HOSPITAL_COMMUNITY)
Admission: RE | Admit: 2019-01-19 | Discharge: 2019-01-19 | Disposition: A | Discharge status: Home / Self Care | Payer: Medicare Other | Source: Ambulatory Visit | Attending: Interventional Cardiology | Admitting: Interventional Cardiology

## 2019-01-19 DIAGNOSIS — Z955 Presence of coronary angioplasty implant and graft: Secondary | ICD-10-CM | POA: Diagnosis not present

## 2019-01-20 NOTE — Progress Notes (Signed)
Cardiac Individual Treatment Plan  Patient Details  Name: ZYMIR NAPOLI MRN: [id number] Date of Birth: [birth year] Referring Provider:     CARDIAC REHAB PHASE II ORIENTATION from 12/30/2018 in [facility] CARDIAC [facility]  Referring Provider  Dr.Varanasi      Initial Encounter Date:    CARDIAC REHAB PHASE II ORIENTATION from 12/30/2018 in [facility] CARDIAC REHAB  Date  12/30/18      Visit Diagnosis: Status post coronary artery stent placement,11/01/18 DES RCA  Patient's Home Medications on Admission:  Current Outpatient Medications:  .  amLODipine (NORVASC) 10 MG tablet, Take 1 tablet (10 mg total) by mouth daily., Disp: 90 tablet, Rfl: 3 .  aspirin EC 81 MG EC tablet, Take 1 tablet (81 mg total) by mouth daily., Disp: 30 tablet, Rfl: 10 .  atorvastatin (LIPITOR) 40 MG tablet, Take 1 tablet (40 mg total) by mouth daily at 6 PM., Disp: 90 tablet, Rfl: 3 .  carvedilol (COREG) 6.25 MG tablet, Take 1 tablet (6.25 mg total) by mouth 2 (two) times daily with a meal., Disp: 180 tablet, Rfl: 3 .  clopidogrel (PLAVIX) 75 MG tablet, Take 1 tablet (75 mg total) by mouth daily with breakfast., Disp: 90 tablet, Rfl: 3 .  ezetimibe (ZETIA) 10 MG tablet, Take 1 tablet (10 mg total) by mouth daily at 6 PM., Disp: 90 tablet, Rfl: 3 .  finasteride (PROSCAR) 5 MG tablet, Take 5 mg by mouth daily., Disp: , Rfl: 3 .  nitroGLYCERIN (NITROSTAT) 0.4 MG SL tablet, Place 1 tablet (0.4 mg total) under the tongue every 5 (five) minutes as needed for chest pain., Disp: 25 tablet, Rfl: 3 .  Omega-3 Fatty Acids (FISH OIL PO), Take 2 capsules by mouth daily., Disp: , Rfl:   Past Medical History: Past Medical History:  Diagnosis Date  . CAD S/P percutaneous coronary angioplasty    s/p DES to prox RCA, DES to distal RCA, 11/01/18, residual mild nonobs dz in mid RCA, mid LAD and prox LCx  . Hypercholesteremia   . Hypertension   . Prostate enlargement     Tobacco Use: Social  History   Tobacco Use  Smoking Status Never Smoker  Smokeless Tobacco Never Used    Labs: Recent Review Advice worker    Labs for ITP Cardiac and Pulmonary Rehab Latest Ref Rng & Units 06/07/2013 11/02/2018   Cholestrol 0 - 200 mg/dL - 409   LDLCALC 0 - 99 mg/dL - 65   HDL >81 mg/dL - 19(J)   Trlycerides <478 mg/dL - 81   TCO2 0 - 295 mmol/L 22 -      Capillary Blood Glucose: No results found for: GLUCAP   Exercise Target Goals: Exercise Program Goal: Individual exercise prescription set using results from initial 6 min walk test and THRR while considering  patient's activity barriers and safety.   Exercise Prescription Goal: Initial exercise prescription builds to 30-45 minutes a day of aerobic activity, 2-3 days per week.  Home exercise guidelines will be given to patient during program as part of exercise prescription that the participant will acknowledge.  Activity Barriers & Risk Stratification: Activity Barriers & Cardiac Risk Stratification - 12/30/18 1454      Activity Barriers & Cardiac Risk Stratification   Activity Barriers  None    Cardiac Risk Stratification  Moderate       6 Minute Walk: 6 Minute Walk    Row Name 12/30/18 1453  6 Minute Walk   Phase  Initial     Distance  1612 feet     Walk Time  6 minutes     # of Rest Breaks  0     MPH  3.1     METS  3.42     RPE  11     VO2 Peak  11.96     Symptoms  No     Resting HR  75 bpm     Resting BP  124/62     Resting Oxygen Saturation   98 %     Exercise Oxygen Saturation  during 6 min walk  98 %     Max Ex. HR  116 bpm     Max Ex. BP  130/68     2 Minute Post BP  118/72        Oxygen Initial Assessment:   Oxygen Re-Evaluation:   Oxygen Discharge (Final Oxygen Re-Evaluation):   Initial Exercise Prescription: Initial Exercise Prescription - 12/30/18 1400      Date of Initial Exercise RX and Referring Provider   Date  12/30/18    Referring Provider  Dr.Varanasi    Expected  Discharge Date  04/08/19      Bike   Level  1    Minutes  10    METs  2.89      NuStep   Level  3    SPM  85    Minutes  10    METs  3      Track   Laps  11    Minutes  10    METs  2.91      Prescription Details   Frequency (times per week)  3    Duration  Progress to 30 minutes of continuous aerobic without signs/symptoms of physical distress      Intensity   THRR 40-80% of Max Heartrate  60-120    Ratings of Perceived Exertion  11-13      Progression   Progression  Continue to progress workloads to maintain intensity without signs/symptoms of physical distress.      Resistance Training   Training Prescription  Yes    Weight  3 lbs.     Reps  10-15       Perform Capillary Blood Glucose checks as needed.  Exercise Prescription Changes: Exercise Prescription Changes    Row Name 01/03/19 1450 01/10/19 1453           Response to Exercise   Blood Pressure (Admit)  120/70  142/80      Blood Pressure (Exercise)  152/74  168/82      Blood Pressure (Exit)  120/68  118/72      Heart Rate (Admit)  96 bpm  74 bpm      Heart Rate (Exercise)  102 bpm  111 bpm      Heart Rate (Exit)  78 bpm  73 bpm      Rating of Perceived Exertion (Exercise)  12  12      Symptoms  none  none      Duration  Progress to 30 minutes of  aerobic without signs/symptoms of physical distress  Progress to 30 minutes of  aerobic without signs/symptoms of physical distress      Intensity  THRR unchanged  THRR unchanged        Progression   Progression  Continue to progress workloads to maintain intensity without signs/symptoms of physical distress.  Continue  to progress workloads to maintain intensity without signs/symptoms of physical distress.      Average METs  2.6  3.6        Resistance Training   Training Prescription  Yes  Yes      Weight  3lbs  3lbs      Reps  10-15  10-15      Time  10 Minutes  10 Minutes        Interval Training   Interval Training  No  No        Bike   Level   1  1.8      Minutes  10  10      METs  2.91  4.39        NuStep   Level  3  3      SPM  85  85      Minutes  10  10      METs  2.1  3        Track   Laps  10  13      Minutes  10  10      METs  2.74  3.26         Exercise Comments: Exercise Comments    Row Name 01/05/19 1541 01/10/19 1509 01/14/19 1506       Exercise Comments  Patient off to a good start with exercise. Will progress workloads as tolerated.  Reviewed METs with patient.  Reviewed home exercise guidelines, METs, and goals with patient.        Exercise Goals and Review: Exercise Goals    Row Name 12/30/18 1408             Exercise Goals   Increase Physical Activity  Yes       Intervention  Provide advice, education, support and counseling about physical activity/exercise needs.;Develop an individualized exercise prescription for aerobic and resistive training based on initial evaluation findings, risk stratification, comorbidities and participant's personal goals.       Expected Outcomes  Short Term: Attend rehab on a regular basis to increase amount of physical activity.;Long Term: Exercising regularly at least 3-5 days a week.;Long Term: Add in home exercise to make exercise part of routine and to increase amount of physical activity.       Increase Strength and Stamina  Yes       Intervention  Provide advice, education, support and counseling about physical activity/exercise needs.;Develop an individualized exercise prescription for aerobic and resistive training based on initial evaluation findings, risk stratification, comorbidities and participant's personal goals.       Expected Outcomes  Short Term: Increase workloads from initial exercise prescription for resistance, speed, and METs.;Short Term: Perform resistance training exercises routinely during rehab and add in resistance training at home;Long Term: Improve cardiorespiratory fitness, muscular endurance and strength as measured by increased METs and  functional capacity (6MWT)       Able to understand and use rate of perceived exertion (RPE) scale  Yes       Intervention  Provide education and explanation on how to use RPE scale       Expected Outcomes  Short Term: Able to use RPE daily in rehab to express subjective intensity level;Long Term:  Able to use RPE to guide intensity level when exercising independently       Knowledge and understanding of Target Heart Rate Range (THRR)  Yes       Intervention  Provide education  and explanation of THRR including how the numbers were predicted and where they are located for reference       Expected Outcomes  Short Term: Able to state/look up THRR;Long Term: Able to use THRR to govern intensity when exercising independently;Short Term: Able to use daily as guideline for intensity in rehab       Able to check pulse independently  Yes       Intervention  Provide education and demonstration on how to check pulse in carotid and radial arteries.;Review the importance of being able to check your own pulse for safety during independent exercise       Expected Outcomes  Short Term: Able to explain why pulse checking is important during independent exercise;Long Term: Able to check pulse independently and accurately       Understanding of Exercise Prescription  Yes       Intervention  Provide education, explanation, and written materials on patient's individual exercise prescription       Expected Outcomes  Short Term: Able to explain program exercise prescription;Long Term: Able to explain home exercise prescription to exercise independently          Exercise Goals Re-Evaluation : Exercise Goals Re-Evaluation    Row Name 01/05/19 1541 01/14/19 1506           Exercise Goal Re-Evaluation   Exercise Goals Review  Increase Physical Activity;Able to understand and use rate of perceived exertion (RPE) scale  Increase Physical Activity;Able to understand and use rate of perceived exertion (RPE)  scale;Understanding of Exercise Prescription;Knowledge and understanding of Target Heart Rate Range (THRR);Able to check pulse independently      Comments  Patient able to understand and use RPE scale appropriately.  Reviewed home exercise guidelines with patient including THRR, RPE scale, and endpoints for exercise. Pt plans to walk as his mode of home exericse. Pt knows how to check his pulse.      Expected Outcomes  Increase workloads as tolerated to help achieve personal health and fitness goals.  Patient will walk at least 30 minutes, 2 days per week in addition to exercise at cardiac rehab.         Discharge Exercise Prescription (Final Exercise Prescription Changes): Exercise Prescription Changes - 01/10/19 1453      Response to Exercise   Blood Pressure (Admit)  142/80    Blood Pressure (Exercise)  168/82    Blood Pressure (Exit)  118/72    Heart Rate (Admit)  74 bpm    Heart Rate (Exercise)  111 bpm    Heart Rate (Exit)  73 bpm    Rating of Perceived Exertion (Exercise)  12    Symptoms  none    Duration  Progress to 30 minutes of  aerobic without signs/symptoms of physical distress    Intensity  THRR unchanged      Progression   Progression  Continue to progress workloads to maintain intensity without signs/symptoms of physical distress.    Average METs  3.6      Resistance Training   Training Prescription  Yes    Weight  3lbs    Reps  10-15    Time  10 Minutes      Interval Training   Interval Training  No      Bike   Level  1.8    Minutes  10    METs  4.39      NuStep   Level  3    SPM  85  Minutes  10    METs  3      Track   Laps  13    Minutes  10    METs  3.26       Nutrition:  Target Goals: Understanding of nutrition guidelines, daily intake of sodium 1500mg , cholesterol 200mg , calories 30% from fat and 7% or less from saturated fats, daily to have 5 or more servings of fruits and vegetables.  Biometrics: Pre Biometrics - 12/30/18 1455       Pre Biometrics   Height  5' 10.5" (1.791 m)    Weight  99.8 kg    Waist Circumference  45 inches    Hip Circumference  41.5 inches    Waist to Hip Ratio  1.08 %    BMI (Calculated)  31.11    Triceps Skinfold  30 mm    % Body Fat  33.9 %    Grip Strength  37 kg    Flexibility  9 in    Single Leg Stand  19.87 seconds        Nutrition Therapy Plan and Nutrition Goals: Nutrition Therapy & Goals - 12/30/18 1549      Nutrition Therapy   Diet  heart healthy      Personal Nutrition Goals   Nutrition Goal  Pt to identify and limit food sources of saturated fat, trans fat, refined carbohydrates and sodium    Personal Goal #2  Pt to identify food quantities necessary to achieve weight loss of 6-24 lbs. at graduation from cardiac rehab.       Intervention Plan   Intervention  Prescribe, educate and counsel regarding individualized specific dietary modifications aiming towards targeted core components such as weight, hypertension, lipid management, diabetes, heart failure and other comorbidities.    Expected Outcomes  Short Term Goal: Understand basic principles of dietary content, such as calories, fat, sodium, cholesterol and nutrients.;Long Term Goal: Adherence to prescribed nutrition plan.       Nutrition Assessments:   Nutrition Goals Re-Evaluation:   Nutrition Goals Re-Evaluation:   Nutrition Goals Discharge (Final Nutrition Goals Re-Evaluation):   Psychosocial: Target Goals: Acknowledge presence or absence of significant depression and/or stress, maximize coping skills, provide positive support system. Participant is able to verbalize types and ability to use techniques and skills needed for reducing stress and depression.  Initial Review & Psychosocial Screening: Initial Psych Review & Screening - 12/30/18 1534      Initial Review   Current issues with  None Identified      Family Dynamics   Good Support System?  Yes   Cordera has his wife for support     Barriers    Psychosocial barriers to participate in program  There are no identifiable barriers or psychosocial needs.      Screening Interventions   Interventions  Encouraged to exercise       Quality of Life Scores: Quality of Life - 12/30/18 1457      Quality of Life   Select  Quality of Life      Quality of Life Scores   Health/Function Pre  28.8 %    Socioeconomic Pre  30 %    Psych/Spiritual Pre  30 %    Family Pre  28.5 %    GLOBAL Pre  29.23 %      Scores of 19 and below usually indicate a poorer quality of life in these areas.  A difference of  2-3 points is a clinically meaningful difference.  A difference of 2-3 points in the total score of the Quality of Life Index has been associated with significant improvement in overall quality of life, self-image, physical symptoms, and general health in studies assessing change in quality of life.  PHQ-9: Recent Review Flowsheet Data    Depression screen [facility] 2/9 01/03/2019   Decreased Interest 0   Down, Depressed, Hopeless 0   PHQ - 2 Score 0     Interpretation of Total Score  Total Score Depression Severity:  1-4 = Minimal depression, 5-9 = Mild depression, 10-14 = Moderate depression, 15-19 = Moderately severe depression, 20-27 = Severe depression   Psychosocial Evaluation and Intervention:   Psychosocial Re-Evaluation: Psychosocial Re-Evaluation    Row Name 01/20/19 1432             Psychosocial Re-Evaluation   Current issues with  None Identified       Interventions  Encouraged to attend Cardiac Rehabilitation for the exercise       Continue Psychosocial Services   No Follow up required          Psychosocial Discharge (Final Psychosocial Re-Evaluation): Psychosocial Re-Evaluation - 01/20/19 1432      Psychosocial Re-Evaluation   Current issues with  None Identified    Interventions  Encouraged to attend Cardiac Rehabilitation for the exercise    Continue Psychosocial Services   No Follow up required        Vocational Rehabilitation: Provide vocational rehab assistance to qualifying candidates.   Vocational Rehab Evaluation & Intervention: Vocational Rehab - 12/30/18 1536      Initial Vocational Rehab Evaluation & Intervention   Assessment shows need for Vocational Rehabilitation  No   Mr Bialecki is retired and does not need vocational rehab at this time       Education: Education Goals: Education classes will be provided on a weekly basis, covering required topics. Participant will state understanding/return demonstration of topics presented.  Learning Barriers/Preferences: Learning Barriers/Preferences - 12/30/18 1458      Learning Barriers/Preferences   Learning Barriers  None    Learning Preferences  Written Material;Video;Pictoral;Skilled Demonstration       Education Topics: Count Your Pulse:  -Group instruction provided by verbal instruction, demonstration, patient participation and written materials to support subject.  Instructors address importance of being able to find your pulse and how to count your pulse when at home without a heart monitor.  Patients get hands on experience counting their pulse with staff help and individually.   CARDIAC REHAB PHASE II EXERCISE from 01/07/2019 in [facility] CARDIAC REHAB  Date  01/07/19  Instruction Review Code  2- Demonstrated Understanding      Heart Attack, Angina, and Risk Factor Modification:  -Group instruction provided by verbal instruction, video, and written materials to support subject.  Instructors address signs and symptoms of angina and heart attacks.    Also discuss risk factors for heart disease and how to make changes to improve heart health risk factors.   Functional Fitness:  -Group instruction provided by verbal instruction, demonstration, patient participation, and written materials to support subject.  Instructors address safety measures for doing things around the house.  Discuss how to get  up and down off the floor, how to pick things up properly, how to safely get out of a chair without assistance, and balance training.   Meditation and Mindfulness:  -Group instruction provided by verbal instruction, patient participation, and written materials to support subject.  Instructor addresses importance of mindfulness  and meditation practice to help reduce stress and improve awareness.  Instructor also leads participants through a meditation exercise.    Stretching for Flexibility and Mobility:  -Group instruction provided by verbal instruction, patient participation, and written materials to support subject.  Instructors lead participants through series of stretches that are designed to increase flexibility thus improving mobility.  These stretches are additional exercise for major muscle groups that are typically performed during regular warm up and cool down.   Hands Only CPR:  -Group verbal, video, and participation provides a basic overview of AHA guidelines for community CPR. Role-play of emergencies allow participants the opportunity to practice calling for help and chest compression technique with discussion of AED use.   Hypertension: -Group verbal and written instruction that provides a basic overview of hypertension including the most recent diagnostic guidelines, risk factor reduction with self-care instructions and medication management.    Nutrition I class: Heart Healthy Eating:  -Group instruction provided by PowerPoint slides, verbal discussion, and written materials to support subject matter. The instructor gives an explanation and review of the Therapeutic Lifestyle Changes diet recommendations, which includes a discussion on lipid goals, dietary fat, sodium, fiber, plant stanol/sterol esters, sugar, and the components of a well-balanced, healthy diet.   Nutrition II class: Lifestyle Skills:  -Group instruction provided by PowerPoint slides, verbal discussion, and  written materials to support subject matter. The instructor gives an explanation and review of label reading, grocery shopping for heart health, heart healthy recipe modifications, and ways to make healthier choices when eating out.   Diabetes Question & Answer:  -Group instruction provided by PowerPoint slides, verbal discussion, and written materials to support subject matter. The instructor gives an explanation and review of diabetes co-morbidities, pre- and post-prandial blood glucose goals, pre-exercise blood glucose goals, signs, symptoms, and treatment of hypoglycemia and hyperglycemia, and foot care basics.   Diabetes Blitz:  -Group instruction provided by PowerPoint slides, verbal discussion, and written materials to support subject matter. The instructor gives an explanation and review of the physiology behind type 1 and type 2 diabetes, diabetes medications and rational behind using different medications, pre- and post-prandial blood glucose recommendations and Hemoglobin A1c goals, diabetes diet, and exercise including blood glucose guidelines for exercising safely.    Portion Distortion:  -Group instruction provided by PowerPoint slides, verbal discussion, written materials, and food models to support subject matter. The instructor gives an explanation of serving size versus portion size, changes in portions sizes over the last 20 years, and what consists of a serving from each food group.   Stress Management:  -Group instruction provided by verbal instruction, video, and written materials to support subject matter.  Instructors review role of stress in heart disease and how to cope with stress positively.     [facility]  Date  01/12/19  Educator  RN  Instruction Review Code  2- Demonstrated Understanding      Exercising on Your Own:  -Group instruction provided by verbal instruction, power point,  and written materials to support subject.  Instructors discuss benefits of exercise, components of exercise, frequency and intensity of exercise, and end points for exercise.  Also discuss use of nitroglycerin and activating EMS.  Review options of places to exercise outside of rehab.  Review guidelines for sex with heart disease.   Cardiac Drugs I:  -Group instruction provided by verbal instruction and written materials to support subject.  Instructor reviews  cardiac drug classes: antiplatelets, anticoagulants, beta blockers, and statins.  Instructor discusses reasons, side effects, and lifestyle considerations for each drug class.   Cardiac Drugs II:  -Group instruction provided by verbal instruction and written materials to support subject.  Instructor reviews cardiac drug classes: angiotensin converting enzyme inhibitors (ACE-I), angiotensin II receptor blockers (ARBs), nitrates, and calcium channel blockers.  Instructor discusses reasons, side effects, and lifestyle considerations for each drug class.   Anatomy and Physiology of the Circulatory System:  Group verbal and written instruction and models provide basic cardiac anatomy and physiology, with the coronary electrical and arterial systems. Review of: AMI, Angina, Valve disease, Heart Failure, Peripheral Artery Disease, Cardiac Arrhythmia, Pacemakers, and the ICD.   CARDIAC REHAB PHASE II EXERCISE from 01/19/2019 in [facility] CARDIAC REHAB  Date  01/19/19  Instruction Review Code  2- Demonstrated Understanding      Other Education:  -Group or individual verbal, written, or video instructions that support the educational goals of the cardiac rehab program.   Holiday Eating Survival Tips:  -Group instruction provided by PowerPoint slides, verbal discussion, and written materials to support subject matter. The instructor gives patients tips, tricks, and techniques to help them not only survive but enjoy the holidays  despite the onslaught of food that accompanies the holidays.   Knowledge Questionnaire Score: Knowledge Questionnaire Score - 12/30/18 1404      Knowledge Questionnaire Score   Pre Score  20/24       Core Components/Risk Factors/Patient Goals at Admission: Personal Goals and Risk Factors at Admission - 12/30/18 1459      Core Components/Risk Factors/Patient Goals on Admission    Weight Management  Yes;Obesity;Weight Maintenance;Weight Loss    Admit Weight  220 lb 0.3 oz (99.8 kg)    Expected Outcomes  Short Term: Continue to assess and modify interventions until short term weight is achieved;Long Term: Adherence to nutrition and physical activity/exercise program aimed toward attainment of established weight goal;Weight Maintenance: Understanding of the daily nutrition guidelines, which includes 25-35% calories from fat, 7% or less cal from saturated fats, less than 200mg  cholesterol, less than 1.5gm of sodium, & 5 or more servings of fruits and vegetables daily;Weight Loss: Understanding of general recommendations for a balanced deficit meal plan, which promotes 1-2 lb weight loss per week and includes a negative energy balance of [id number] kcal/d;Understanding recommendations for meals to include 15-35% energy as protein, 25-35% energy from fat, 35-60% energy from carbohydrates, less than 200mg  of dietary cholesterol, 20-35 gm of total fiber daily;Understanding of distribution of calorie intake throughout the day with the consumption of 4-5 meals/snacks    Hypertension  Yes    Intervention  Provide education on lifestyle modifcations including regular physical activity/exercise, weight management, moderate sodium restriction and increased consumption of fresh fruit, vegetables, and low fat dairy, alcohol moderation, and smoking cessation.;Monitor prescription use compliance.    Expected Outcomes  Short Term: Continued assessment and intervention until BP is < 140/66mm HG in hypertensive  participants. < 130/36mm HG in hypertensive participants with diabetes, heart failure or chronic kidney disease.;Long Term: Maintenance of blood pressure at goal levels.    Lipids  Yes    Intervention  Provide education and support for participant on nutrition & aerobic/resistive exercise along with prescribed medications to achieve LDL 70mg , HDL >40mg .    Expected Outcomes  Short Term: Participant states understanding of desired cholesterol values and is compliant with medications prescribed. Participant is following exercise prescription and nutrition guidelines.;Long Term: Cholesterol  controlled with medications as prescribed, with individualized exercise RX and with personalized nutrition plan. Value goals: LDL < 70mg , HDL > 40 mg.    Stress  Yes    Intervention  Offer individual and/or small group education and counseling on adjustment to heart disease, stress management and health-related lifestyle change. Teach and support self-help strategies.;Refer participants experiencing significant psychosocial distress to appropriate mental health specialists for further evaluation and treatment. When possible, include family members and significant others in education/counseling sessions.    Expected Outcomes  Short Term: Participant demonstrates changes in health-related behavior, relaxation and other stress management skills, ability to obtain effective social support, and compliance with psychotropic medications if prescribed.;Long Term: Emotional wellbeing is indicated by absence of clinically significant psychosocial distress or social isolation.       Core Components/Risk Factors/Patient Goals Review:  Goals and Risk Factor Review    Row Name 01/20/19 1432             Core Components/Risk Factors/Patient Goals Review   Personal Goals Review  Weight Management/Obesity;Lipids;Hypertension;Stress       Review  Devesh is doing well with exercise. Reyn has had some moderate exertional BP  elevations. Will continue to monitor. Resting BP's WNL       Expected Outcomes  Patient will continue to participate in phase 2 cardiac rehab for nutrition and lifestyle modifications.          Core Components/Risk Factors/Patient Goals at Discharge (Final Review):  Goals and Risk Factor Review - 01/20/19 1432      Core Components/Risk Factors/Patient Goals Review   Personal Goals Review  Weight Management/Obesity;Lipids;Hypertension;Stress    Review  Arthel is doing well with exercise. Mandy has had some moderate exertional BP elevations. Will continue to monitor. Resting BP's WNL    Expected Outcomes  Patient will continue to participate in phase 2 cardiac rehab for nutrition and lifestyle modifications.       ITP Comments: ITP Comments    Row Name 12/30/18 1404 01/20/19 1431         ITP Comments  Dr. Armanda Magic, Mediacl Director  30 Day ITP comments. Kristof is with good attendance and participation in phase 2 cardiac rehab         Comments: See ITP comments.Gladstone Lighter, RN,BSN 01/20/2019 2:40 PM

## 2019-01-21 ENCOUNTER — Ambulatory Visit (HOSPITAL_COMMUNITY): Payer: BC Managed Care – PPO

## 2019-01-21 ENCOUNTER — Encounter (HOSPITAL_COMMUNITY)
Admission: RE | Admit: 2019-01-21 | Discharge: 2019-01-21 | Disposition: A | Discharge status: Home / Self Care | Payer: Medicare Other | Source: Ambulatory Visit | Attending: Interventional Cardiology | Admitting: Interventional Cardiology

## 2019-01-21 DIAGNOSIS — Z955 Presence of coronary angioplasty implant and graft: Secondary | ICD-10-CM | POA: Diagnosis not present

## 2019-01-24 ENCOUNTER — Encounter (HOSPITAL_COMMUNITY)
Admission: RE | Admit: 2019-01-24 | Discharge: 2019-01-24 | Disposition: A | Discharge status: Home / Self Care | Payer: Medicare Other | Source: Ambulatory Visit | Attending: Interventional Cardiology | Admitting: Interventional Cardiology

## 2019-01-24 ENCOUNTER — Ambulatory Visit (HOSPITAL_COMMUNITY): Payer: BC Managed Care – PPO

## 2019-01-24 DIAGNOSIS — Z955 Presence of coronary angioplasty implant and graft: Secondary | ICD-10-CM | POA: Diagnosis not present

## 2019-01-26 ENCOUNTER — Encounter (HOSPITAL_COMMUNITY)
Admission: RE | Admit: 2019-01-26 | Discharge: 2019-01-26 | Disposition: A | Discharge status: Home / Self Care | Payer: Medicare Other | Source: Ambulatory Visit | Attending: Interventional Cardiology | Admitting: Interventional Cardiology

## 2019-01-26 ENCOUNTER — Ambulatory Visit (HOSPITAL_COMMUNITY): Payer: BC Managed Care – PPO

## 2019-01-26 DIAGNOSIS — Z955 Presence of coronary angioplasty implant and graft: Secondary | ICD-10-CM | POA: Diagnosis not present

## 2019-01-28 ENCOUNTER — Encounter (HOSPITAL_COMMUNITY)
Admission: RE | Admit: 2019-01-28 | Discharge: 2019-01-28 | Disposition: A | Discharge status: Home / Self Care | Payer: Medicare Other | Source: Ambulatory Visit | Attending: Interventional Cardiology | Admitting: Interventional Cardiology

## 2019-01-28 ENCOUNTER — Telehealth: Payer: Self-pay | Admitting: Interventional Cardiology

## 2019-01-28 ENCOUNTER — Ambulatory Visit (HOSPITAL_COMMUNITY): Payer: BC Managed Care – PPO

## 2019-01-28 DIAGNOSIS — Z955 Presence of coronary angioplasty implant and graft: Secondary | ICD-10-CM | POA: Diagnosis not present

## 2019-01-28 NOTE — Telephone Encounter (Signed)
New Message   Byrd Hesselbach from outpatient cardiac rehab wants to speak to triage nurse about sending some EKG results over.

## 2019-01-28 NOTE — Telephone Encounter (Signed)
Called and spoke to Damon at Cardiac Rehab. She states that the patient had a short run of SVT 150-175 bpm. Patient asymptomatic. Patient takes carvedilol 6.25 mg BID. Byrd Hesselbach states that she reviewed this with Theodore Demark, PA because she was already speaking to her about another patient and Bjorn Loser advised no changes were needed. Byrd Hesselbach states that she will fax over strips for Dr. Eldridge Dace to review when he is back in the office.

## 2019-01-28 NOTE — Progress Notes (Addendum)
Neven had a nonsustained run of SVT this afternoon while on the nustep. Rate 150-175. Blood pressure 152/78. Patient asymptomatic. Rhonda Barrett PAC called and notified. No new order received. Lynwood is taking his medications as prescribed. Will fax exercise flow sheets to Dr. Hoyle Barr office for review with today's ECG tracings. Exit blood pressure 118/70. Heart rate 81. Oxygen saturation 98% on room air.Gladstone Lighter, RN,BSN 01/28/2019 3:57 PM

## 2019-01-31 ENCOUNTER — Ambulatory Visit (HOSPITAL_COMMUNITY): Payer: BC Managed Care – PPO

## 2019-01-31 ENCOUNTER — Encounter (HOSPITAL_COMMUNITY)
Admission: RE | Admit: 2019-01-31 | Discharge: 2019-01-31 | Disposition: A | Discharge status: Home / Self Care | Payer: Medicare Other | Source: Ambulatory Visit | Attending: Interventional Cardiology | Admitting: Interventional Cardiology

## 2019-01-31 DIAGNOSIS — Z955 Presence of coronary angioplasty implant and graft: Secondary | ICD-10-CM

## 2019-02-02 ENCOUNTER — Ambulatory Visit (HOSPITAL_COMMUNITY): Payer: BC Managed Care – PPO

## 2019-02-02 ENCOUNTER — Encounter (HOSPITAL_COMMUNITY)
Admission: RE | Admit: 2019-02-02 | Discharge: 2019-02-02 | Disposition: A | Discharge status: Home / Self Care | Payer: Medicare Other | Source: Ambulatory Visit | Attending: Interventional Cardiology | Admitting: Interventional Cardiology

## 2019-02-02 DIAGNOSIS — Z955 Presence of coronary angioplasty implant and graft: Secondary | ICD-10-CM

## 2019-02-02 NOTE — Progress Notes (Signed)
Intermittent exertional blood pressure reading noted in the 170-180's systolic. During the last few exercise sessions. Patient asymptomatic. Javien has been taking his medications as prescribed. Entry blood pressure today 142/78. Heart rate 66. Blood pressure 170/78 on the airdyne. Max  Heart rate 117 today with exercise. Exit blood pressure 120/72. Heart rate 66. Daleen Bo RN called and notified at Dr Hoyle Barr office.Will fax exercise flow sheets to Dr. Hoyle Barr office for review.Gladstone Lighter, RN,BSN 02/02/2019 4:53 PM

## 2019-02-04 ENCOUNTER — Ambulatory Visit (HOSPITAL_COMMUNITY): Payer: BC Managed Care – PPO

## 2019-02-04 ENCOUNTER — Encounter (HOSPITAL_COMMUNITY)
Admission: RE | Admit: 2019-02-04 | Discharge: 2019-02-04 | Disposition: A | Discharge status: Home / Self Care | Payer: Medicare Other | Source: Ambulatory Visit | Attending: Interventional Cardiology | Admitting: Interventional Cardiology

## 2019-02-04 DIAGNOSIS — Z955 Presence of coronary angioplasty implant and graft: Secondary | ICD-10-CM | POA: Diagnosis not present

## 2019-02-04 MED ORDER — CARVEDILOL 12.5 MG PO TABS: 12.5000 mg | ORAL_TABLET | Freq: Two times a day (BID) | ORAL | 3 refills | Status: DC

## 2019-02-04 NOTE — Telephone Encounter (Signed)
Left message for patient to call back.   Reviewed strips with Dr. Eldridge Dace. Spoke with Rocky Crafts in the week and she also rerpoted HTN with exercise. Patient asymptomatic. Per Dr. Eldridge Dace, patient will need to increase carvedilol to 12.5 mg BID.

## 2019-02-04 NOTE — Telephone Encounter (Signed)
Patient returned call. Instructed patient to increase carvedilol to 12.5 mg BID. Patient verbalized understanding and thanked me for the call.

## 2019-02-07 ENCOUNTER — Encounter (HOSPITAL_COMMUNITY)
Admission: RE | Admit: 2019-02-07 | Discharge: 2019-02-07 | Disposition: A | Discharge status: Home / Self Care | Payer: Medicare Other | Source: Ambulatory Visit | Attending: Interventional Cardiology | Admitting: Interventional Cardiology

## 2019-02-07 ENCOUNTER — Ambulatory Visit (HOSPITAL_COMMUNITY): Payer: BC Managed Care – PPO

## 2019-02-07 DIAGNOSIS — Z955 Presence of coronary angioplasty implant and graft: Secondary | ICD-10-CM | POA: Diagnosis not present

## 2019-02-09 ENCOUNTER — Ambulatory Visit (HOSPITAL_COMMUNITY): Payer: BC Managed Care – PPO

## 2019-02-09 ENCOUNTER — Encounter (HOSPITAL_COMMUNITY)
Admission: RE | Admit: 2019-02-09 | Discharge: 2019-02-09 | Disposition: A | Discharge status: Home / Self Care | Payer: Medicare Other | Source: Ambulatory Visit | Attending: Interventional Cardiology | Admitting: Interventional Cardiology

## 2019-02-09 DIAGNOSIS — Z955 Presence of coronary angioplasty implant and graft: Secondary | ICD-10-CM

## 2019-02-10 NOTE — Progress Notes (Signed)
Cardiac Individual Treatment Plan  Patient Details  Name: Matthew Henderson MRN: [id number] Date of Birth: 03/04/1946 Referring Provider:     CARDIAC REHAB PHASE II ORIENTATION from 12/30/2018 in [facility] CARDIAC [facility]  Referring Provider  Dr.Varanasi      Initial Encounter Date:    CARDIAC REHAB PHASE II ORIENTATION from 12/30/2018 in [facility] CARDIAC REHAB  Date  12/30/18      Visit Diagnosis: Status post coronary artery stent placement,11/01/18 DES RCA  Patient's Home Medications on Admission:  Current Outpatient Medications:  .  amLODipine (NORVASC) 10 MG tablet, Take 1 tablet (10 mg total) by mouth daily., Disp: 90 tablet, Rfl: 3 .  aspirin EC 81 MG EC tablet, Take 1 tablet (81 mg total) by mouth daily., Disp: 30 tablet, Rfl: 10 .  atorvastatin (LIPITOR) 40 MG tablet, Take 1 tablet (40 mg total) by mouth daily at 6 PM., Disp: 90 tablet, Rfl: 3 .  carvedilol (COREG) 12.5 MG tablet, Take 1 tablet (12.5 mg total) by mouth 2 (two) times daily., Disp: 180 tablet, Rfl: 3 .  clopidogrel (PLAVIX) 75 MG tablet, Take 1 tablet (75 mg total) by mouth daily with breakfast., Disp: 90 tablet, Rfl: 3 .  ezetimibe (ZETIA) 10 MG tablet, Take 1 tablet (10 mg total) by mouth daily at 6 PM., Disp: 90 tablet, Rfl: 3 .  finasteride (PROSCAR) 5 MG tablet, Take 5 mg by mouth daily., Disp: , Rfl: 3 .  nitroGLYCERIN (NITROSTAT) 0.4 MG SL tablet, Place 1 tablet (0.4 mg total) under the tongue every 5 (five) minutes as needed for chest pain., Disp: 25 tablet, Rfl: 3 .  Omega-3 Fatty Acids (FISH OIL PO), Take 2 capsules by mouth daily., Disp: , Rfl:   Past Medical History: Past Medical History:  Diagnosis Date  . CAD S/P percutaneous coronary angioplasty    s/p DES to prox RCA, DES to distal RCA, 11/01/18, residual mild nonobs dz in mid RCA, mid LAD and prox LCx  . Hypercholesteremia   . Hypertension   . Prostate enlargement     Tobacco Use: Social History    Tobacco Use  Smoking Status Never Smoker  Smokeless Tobacco Never Used    Labs: Recent Review Advice worker    Labs for ITP Cardiac and Pulmonary Rehab Latest Ref Rng & Units 06/07/2013 11/02/2018   Cholestrol 0 - 200 mg/dL - 409   LDLCALC 0 - 99 mg/dL - 65   HDL >81 mg/dL - 19(J)   Trlycerides <478 mg/dL - 81   TCO2 0 - 295 mmol/L 22 -      Capillary Blood Glucose: No results found for: GLUCAP   Exercise Target Goals: Exercise Program Goal: Individual exercise prescription set using results from initial 6 min walk test and THRR while considering  patient's activity barriers and safety.   Exercise Prescription Goal: Initial exercise prescription builds to 30-45 minutes a day of aerobic activity, 2-3 days per week.  Home exercise guidelines will be given to patient during program as part of exercise prescription that the participant will acknowledge.  Activity Barriers & Risk Stratification: Activity Barriers & Cardiac Risk Stratification - 12/30/18 1454      Activity Barriers & Cardiac Risk Stratification   Activity Barriers  None    Cardiac Risk Stratification  Moderate       6 Minute Walk: 6 Minute Walk    Row Name 12/30/18 1453         6 Minute Walk  Phase  Initial     Distance  1612 feet     Walk Time  6 minutes     # of Rest Breaks  0     MPH  3.1     METS  3.42     RPE  11     VO2 Peak  11.96     Symptoms  No     Resting HR  75 bpm     Resting BP  124/62     Resting Oxygen Saturation   98 %     Exercise Oxygen Saturation  during 6 min walk  98 %     Max Ex. HR  116 bpm     Max Ex. BP  130/68     2 Minute Post BP  118/72        Oxygen Initial Assessment:   Oxygen Re-Evaluation:   Oxygen Discharge (Final Oxygen Re-Evaluation):   Initial Exercise Prescription: Initial Exercise Prescription - 12/30/18 1400      Date of Initial Exercise RX and Referring Provider   Date  12/30/18    Referring Provider  Dr.Varanasi    Expected Discharge  Date  04/08/19      Bike   Level  1    Minutes  10    METs  2.89      NuStep   Level  3    SPM  85    Minutes  10    METs  3      Track   Laps  11    Minutes  10    METs  2.91      Prescription Details   Frequency (times per week)  3    Duration  Progress to 30 minutes of continuous aerobic without signs/symptoms of physical distress      Intensity   THRR 40-80% of Max Heartrate  60-120    Ratings of Perceived Exertion  11-13      Progression   Progression  Continue to progress workloads to maintain intensity without signs/symptoms of physical distress.      Resistance Training   Training Prescription  Yes    Weight  3 lbs.     Reps  10-15       Perform Capillary Blood Glucose checks as needed.  Exercise Prescription Changes:  Exercise Prescription Changes    Row Name 01/03/19 1450 01/10/19 1453 01/24/19 1451 02/07/19 1450       Response to Exercise   Blood Pressure (Admit)  120/70  142/80  118/82  118/62    Blood Pressure (Exercise)  152/74  168/82  162/80  162/88    Blood Pressure (Exit)  120/68  118/72  130/68  112/60    Heart Rate (Admit)  96 bpm  74 bpm  81 bpm  71 bpm    Heart Rate (Exercise)  102 bpm  111 bpm  114 bpm  106 bpm    Heart Rate (Exit)  78 bpm  73 bpm  80 bpm  71 bpm    Rating of Perceived Exertion (Exercise)  12  12  13  13     Symptoms  none  none  none  none    Duration  Progress to 30 minutes of  aerobic without signs/symptoms of physical distress  Progress to 30 minutes of  aerobic without signs/symptoms of physical distress  Progress to 30 minutes of  aerobic without signs/symptoms of physical distress  Progress to 30 minutes of  aerobic without signs/symptoms of physical distress    Intensity  THRR unchanged  THRR unchanged  THRR unchanged  THRR unchanged      Progression   Progression  Continue to progress workloads to maintain intensity without signs/symptoms of physical distress.  Continue to progress workloads to maintain intensity  without signs/symptoms of physical distress.  Continue to progress workloads to maintain intensity without signs/symptoms of physical distress.  Continue to progress workloads to maintain intensity without signs/symptoms of physical distress.    Average METs  2.6  3.6  3.8  4.1      Resistance Training   Training Prescription  Yes  Yes  Yes  Yes    Weight  3lbs  3lbs  4lbs  5lbs    Reps  10-15  10-15  10-15  10-15    Time  10 Minutes  10 Minutes  10 Minutes  10 Minutes      Interval Training   Interval Training  No  No  No  No      Bike   Level  1  1.8  1.8  2.1    Minutes  10  10  10  10     METs  2.91  4.39  4.42  5.03      NuStep   Level  3  3  4  4     SPM  85  85  85  85    Minutes  10  10  10  10     METs  2.1  3  3.5  4.1      Track   Laps  10  13  15  13     Minutes  10  10  10  10     METs  2.74  3.26  3.6  3.26      Home Exercise Plan   Plans to continue exercise at  -  -  Home (comment) Walking  Home (comment) Walking    Frequency  -  -  Add 1 additional day to program exercise sessions.  Add 1 additional day to program exercise sessions.    Initial Home Exercises Provided  -  -  01/14/19  01/14/19       Exercise Comments:  Exercise Comments    Row Name 01/05/19 1541 01/10/19 1509 01/14/19 1506 01/24/19 1520 02/07/19 1522   Exercise Comments  Patient off to a good start with exercise. Will progress workloads as tolerated.  Reviewed METs with patient.  Reviewed home exercise guidelines, METs, and goals with patient.  Reviewed METs and goals with patient.  METs reviewed with patient.      Exercise Goals and Review:  Exercise Goals    Row Name 12/30/18 1408             Exercise Goals   Increase Physical Activity  Yes       Intervention  Provide advice, education, support and counseling about physical activity/exercise needs.;Develop an individualized exercise prescription for aerobic and resistive training based on initial evaluation findings, risk  stratification, comorbidities and participant's personal goals.       Expected Outcomes  Short Term: Attend rehab on a regular basis to increase amount of physical activity.;Long Term: Exercising regularly at least 3-5 days a week.;Long Term: Add in home exercise to make exercise part of routine and to increase amount of physical activity.       Increase Strength and Stamina  Yes       Intervention  Provide  advice, education, support and counseling about physical activity/exercise needs.;Develop an individualized exercise prescription for aerobic and resistive training based on initial evaluation findings, risk stratification, comorbidities and participant's personal goals.       Expected Outcomes  Short Term: Increase workloads from initial exercise prescription for resistance, speed, and METs.;Short Term: Perform resistance training exercises routinely during rehab and add in resistance training at home;Long Term: Improve cardiorespiratory fitness, muscular endurance and strength as measured by increased METs and functional capacity ( )       Able to understand and use rate of perceived exertion (RPE) scale  Yes       Intervention  Provide education and explanation on how to use RPE scale       Expected Outcomes  Short Term: Able to use RPE daily in rehab to express subjective intensity level;Long Term:  Able to use RPE to guide intensity level when exercising independently       Knowledge and understanding of Target Heart Rate Range (THRR)  Yes       Intervention  Provide education and explanation of THRR including how the numbers were predicted and where they are located for reference       Expected Outcomes  Short Term: Able to state/look up THRR;Long Term: Able to use THRR to govern intensity when exercising independently;Short Term: Able to use daily as guideline for intensity in rehab       Able to check pulse independently  Yes       Intervention  Provide education and demonstration on how to  check pulse in carotid and radial arteries.;Review the importance of being able to check your own pulse for safety during independent exercise       Expected Outcomes  Short Term: Able to explain why pulse checking is important during independent exercise;Long Term: Able to check pulse independently and accurately       Understanding of Exercise Prescription  Yes       Intervention  Provide education, explanation, and written materials on patient's individual exercise prescription       Expected Outcomes  Short Term: Able to explain program exercise prescription;Long Term: Able to explain home exercise prescription to exercise independently          Exercise Goals Re-Evaluation : Exercise Goals Re-Evaluation    Row Name 01/05/19 1541 01/14/19 1506 01/24/19 1520         Exercise Goal Re-Evaluation   Exercise Goals Review  Increase Physical Activity;Able to understand and use rate of perceived exertion (RPE) scale  Increase Physical Activity;Able to understand and use rate of perceived exertion (RPE) scale;Understanding of Exercise Prescription;Knowledge and understanding of Target Heart Rate Range (THRR);Able to check pulse independently  Increase Physical Activity;Able to understand and use rate of perceived exertion (RPE) scale;Understanding of Exercise Prescription;Knowledge and understanding of Target Heart Rate Range (THRR);Able to check pulse independently     Comments  Patient able to understand and use RPE scale appropriately.  Reviewed home exercise guidelines with patient including THRR, RPE scale, and endpoints for exercise. Pt plans to walk as his mode of home exericse. Pt knows how to check his pulse.  Patient not consistently walking at home. Encouraged patient to add 1-2 days/walking at home to help achieve personal weight loss goal.     Expected Outcomes  Increase workloads as tolerated to help achieve personal health and fitness goals.  Patient will walk at least 30 minutes, 2 days  per week in addition to exercise at cardiac  rehab.  Patient will walk 1-2 days/week at home to help achieve goal of losing weight.        Discharge Exercise Prescription (Final Exercise Prescription Changes): Exercise Prescription Changes - 02/07/19 1450      Response to Exercise   Blood Pressure (Admit)  118/62    Blood Pressure (Exercise)  162/88    Blood Pressure (Exit)  112/60    Heart Rate (Admit)  71 bpm    Heart Rate (Exercise)  106 bpm    Heart Rate (Exit)  71 bpm    Rating of Perceived Exertion (Exercise)  13    Symptoms  none    Duration  Progress to 30 minutes of  aerobic without signs/symptoms of physical distress    Intensity  THRR unchanged      Progression   Progression  Continue to progress workloads to maintain intensity without signs/symptoms of physical distress.    Average METs  4.1      Resistance Training   Training Prescription  Yes    Weight  5lbs    Reps  10-15    Time  10 Minutes      Interval Training   Interval Training  No      Bike   Level  2.1    Minutes  10    METs  5.03      NuStep   Level  4    SPM  85    Minutes  10    METs  4.1      Track   Laps  13    Minutes  10    METs  3.26      Home Exercise Plan   Plans to continue exercise at  Home (comment)   Walking   Frequency  Add 1 additional day to program exercise sessions.    Initial Home Exercises Provided  01/14/19       Nutrition:  Target Goals: Understanding of nutrition guidelines, daily intake of sodium 1500mg , cholesterol 200mg , calories 30% from fat and 7% or less from saturated fats, daily to have 5 or more servings of fruits and vegetables.  Biometrics: Pre Biometrics - 12/30/18 1455      Pre Biometrics   Height  5' 10.5" (1.791 m)    Weight  220 lb 0.3 oz (99.8 kg)    Waist Circumference  45 inches    Hip Circumference  41.5 inches    Waist to Hip Ratio  1.08 %    BMI (Calculated)  31.11    Triceps Skinfold  30 mm    % Body Fat  33.9 %    Grip  Strength  37 kg    Flexibility  9 in    Single Leg Stand  19.87 seconds        Nutrition Therapy Plan and Nutrition Goals: Nutrition Therapy & Goals - 12/30/18 1549      Nutrition Therapy   Diet  heart healthy      Personal Nutrition Goals   Nutrition Goal  Pt to identify and limit food sources of saturated fat, trans fat, refined carbohydrates and sodium    Personal Goal #2  Pt to identify food quantities necessary to achieve weight loss of 6-24 lbs. at graduation from cardiac rehab.       Intervention Plan   Intervention  Prescribe, educate and counsel regarding individualized specific dietary modifications aiming towards targeted core components such as weight, hypertension, lipid management, diabetes, heart failure and other  comorbidities.    Expected Outcomes  Short Term Goal: Understand basic principles of dietary content, such as calories, fat, sodium, cholesterol and nutrients.;Long Term Goal: Adherence to prescribed nutrition plan.       Nutrition Assessments:   Nutrition Goals Re-Evaluation:   Nutrition Goals Re-Evaluation:   Nutrition Goals Discharge (Final Nutrition Goals Re-Evaluation):   Psychosocial: Target Goals: Acknowledge presence or absence of significant depression and/or stress, maximize coping skills, provide positive support system. Participant is able to verbalize types and ability to use techniques and skills needed for reducing stress and depression.  Initial Review & Psychosocial Screening: Initial Psych Review & Screening - 12/30/18 1534      Initial Review   Current issues with  None Identified      Family Dynamics   Good Support System?  Yes   Dairon has his wife for support     Barriers   Psychosocial barriers to participate in program  There are no identifiable barriers or psychosocial needs.      Screening Interventions   Interventions  Encouraged to exercise       Quality of Life Scores: Quality of Life - 12/30/18 1457       Quality of Life   Select  Quality of Life      Quality of Life Scores   Health/Function Pre  28.8 %    Socioeconomic Pre  30 %    Psych/Spiritual Pre  30 %    Family Pre  28.5 %    GLOBAL Pre  29.23 %      Scores of 19 and below usually indicate a poorer quality of life in these areas.  A difference of  2-3 points is a clinically meaningful difference.  A difference of 2-3 points in the total score of the Quality of Life Index has been associated with significant improvement in overall quality of life, self-image, physical symptoms, and general health in studies assessing change in quality of life.  PHQ-9: Recent Review Flowsheet Data    Depression screen [facility] 2/9 01/03/2019   Decreased Interest 0   Down, Depressed, Hopeless 0   PHQ - 2 Score 0     Interpretation of Total Score  Total Score Depression Severity:  1-4 = Minimal depression, 5-9 = Mild depression, 10-14 = Moderate depression, 15-19 = Moderately severe depression, 20-27 = Severe depression   Psychosocial Evaluation and Intervention:   Psychosocial Re-Evaluation: Psychosocial Re-Evaluation    Row Name 01/20/19 1432 02/10/19 1646           Psychosocial Re-Evaluation   Current issues with  None Identified  None Identified      Interventions  Encouraged to attend Cardiac Rehabilitation for the exercise  Encouraged to attend Cardiac Rehabilitation for the exercise      Continue Psychosocial Services   No Follow up required  No Follow up required         Psychosocial Discharge (Final Psychosocial Re-Evaluation): Psychosocial Re-Evaluation - 02/10/19 1646      Psychosocial Re-Evaluation   Current issues with  None Identified    Interventions  Encouraged to attend Cardiac Rehabilitation for the exercise    Continue Psychosocial Services   No Follow up required       Vocational Rehabilitation: Provide vocational rehab assistance to qualifying candidates.   Vocational Rehab Evaluation &  Intervention: Vocational Rehab - 12/30/18 1536      Initial Vocational Rehab Evaluation & Intervention   Assessment shows need for Vocational Rehabilitation  No  Mr Gollehon is retired and does not need vocational rehab at this time       Education: Education Goals: Education classes will be provided on a weekly basis, covering required topics. Participant will state understanding/return demonstration of topics presented.  Learning Barriers/Preferences: Learning Barriers/Preferences - 12/30/18 1458      Learning Barriers/Preferences   Learning Barriers  None    Learning Preferences  Written Material;Video;Pictoral;Skilled Demonstration       Education Topics: Count Your Pulse:  -Group instruction provided by verbal instruction, demonstration, patient participation and written materials to support subject.  Instructors address importance of being able to find your pulse and how to count your pulse when at home without a heart monitor.  Patients get hands on experience counting their pulse with staff help and individually.   CARDIAC REHAB PHASE II EXERCISE from 01/07/2019 in [facility] CARDIAC REHAB  Date  01/07/19  Instruction Review Code  2- Demonstrated Understanding      Heart Attack, Angina, and Risk Factor Modification:  -Group instruction provided by verbal instruction, video, and written materials to support subject.  Instructors address signs and symptoms of angina and heart attacks.    Also discuss risk factors for heart disease and how to make changes to improve heart health risk factors.   Functional Fitness:  -Group instruction provided by verbal instruction, demonstration, patient participation, and written materials to support subject.  Instructors address safety measures for doing things around the house.  Discuss how to get up and down off the floor, how to pick things up properly, how to safely get out of a chair without assistance, and balance  training.   CARDIAC REHAB PHASE II EXERCISE from 02/09/2019 in [facility] CARDIAC REHAB  Date  02/04/19  Educator  EP  Instruction Review Code  2- Demonstrated Understanding      Meditation and Mindfulness:  -Group instruction provided by verbal instruction, patient participation, and written materials to support subject.  Instructor addresses importance of mindfulness and meditation practice to help reduce stress and improve awareness.  Instructor also leads participants through a meditation exercise.    Stretching for Flexibility and Mobility:  -Group instruction provided by verbal instruction, patient participation, and written materials to support subject.  Instructors lead participants through series of stretches that are designed to increase flexibility thus improving mobility.  These stretches are additional exercise for major muscle groups that are typically performed during regular warm up and cool down.   Hands Only CPR:  -Group verbal, video, and participation provides a basic overview of AHA guidelines for community CPR. Role-play of emergencies allow participants the opportunity to practice calling for help and chest compression technique with discussion of AED use.   Hypertension: -Group verbal and written instruction that provides a basic overview of hypertension including the most recent diagnostic guidelines, risk factor reduction with self-care instructions and medication management.   CARDIAC REHAB PHASE II EXERCISE from 02/09/2019 in [facility] CARDIAC REHAB  Date  01/21/19  Educator  RN  Instruction Review Code  2- Demonstrated Understanding       Nutrition I class: Heart Healthy Eating:  -Group instruction provided by PowerPoint slides, verbal discussion, and written materials to support subject matter. The instructor gives an explanation and review of the Therapeutic Lifestyle Changes diet recommendations, which includes a  discussion on lipid goals, dietary fat, sodium, fiber, plant stanol/sterol esters, sugar, and the components of a well-balanced, healthy diet.   Nutrition II  class: Lifestyle Skills:  -Group instruction provided by PowerPoint slides, verbal discussion, and written materials to support subject matter. The instructor gives an explanation and review of label reading, grocery shopping for heart health, heart healthy recipe modifications, and ways to make healthier choices when eating out.   Diabetes Question & Answer:  -Group instruction provided by PowerPoint slides, verbal discussion, and written materials to support subject matter. The instructor gives an explanation and review of diabetes co-morbidities, pre- and post-prandial blood glucose goals, pre-exercise blood glucose goals, signs, symptoms, and treatment of hypoglycemia and hyperglycemia, and foot care basics.   Diabetes Blitz:  -Group instruction provided by PowerPoint slides, verbal discussion, and written materials to support subject matter. The instructor gives an explanation and review of the physiology behind type 1 and type 2 diabetes, diabetes medications and rational behind using different medications, pre- and post-prandial blood glucose recommendations and Hemoglobin A1c goals, diabetes diet, and exercise including blood glucose guidelines for exercising safely.    Portion Distortion:  -Group instruction provided by PowerPoint slides, verbal discussion, written materials, and food models to support subject matter. The instructor gives an explanation of serving size versus portion size, changes in portions sizes over the last 20 years, and what consists of a serving from each food group.   Stress Management:  -Group instruction provided by verbal instruction, video, and written materials to support subject matter.  Instructors review role of stress in heart disease and how to cope with stress positively.     CARDIAC REHAB PHASE  II EXERCISE from 02/09/2019 in [facility] CARDIAC REHAB  Date  01/12/19  Educator  RN  Instruction Review Code  2- Demonstrated Understanding      Exercising on Your Own:  -Group instruction provided by verbal instruction, power point, and written materials to support subject.  Instructors discuss benefits of exercise, components of exercise, frequency and intensity of exercise, and end points for exercise.  Also discuss use of nitroglycerin and activating EMS.  Review options of places to exercise outside of rehab.  Review guidelines for sex with heart disease.   CARDIAC REHAB PHASE II EXERCISE from 02/09/2019 in [facility] CARDIAC REHAB  Date  02/09/19  Instruction Review Code  2- Demonstrated Understanding      Cardiac Drugs I:  -Group instruction provided by verbal instruction and written materials to support subject.  Instructor reviews cardiac drug classes: antiplatelets, anticoagulants, beta blockers, and statins.  Instructor discusses reasons, side effects, and lifestyle considerations for each drug class.   Cardiac Drugs II:  -Group instruction provided by verbal instruction and written materials to support subject.  Instructor reviews cardiac drug classes: angiotensin converting enzyme inhibitors (ACE-I), angiotensin II receptor blockers (ARBs), nitrates, and calcium channel blockers.  Instructor discusses reasons, side effects, and lifestyle considerations for each drug class.   CARDIAC REHAB PHASE II EXERCISE from 02/09/2019 in [facility] CARDIAC REHAB  Date  01/26/19  Instruction Review Code  2- Demonstrated Understanding      Anatomy and Physiology of the Circulatory System:  Group verbal and written instruction and models provide basic cardiac anatomy and physiology, with the coronary electrical and arterial systems. Review of: AMI, Angina, Valve disease, Heart Failure, Peripheral Artery Disease, Cardiac Arrhythmia,  Pacemakers, and the ICD.   CARDIAC REHAB PHASE II EXERCISE from 02/09/2019 in [facility] CARDIAC REHAB  Date  01/19/19  Instruction Review Code  2- Demonstrated Understanding      Other Education:  -  Group or individual verbal, written, or video instructions that support the educational goals of the cardiac rehab program.   Holiday Eating Survival Tips:  -Group instruction provided by PowerPoint slides, verbal discussion, and written materials to support subject matter. The instructor gives patients tips, tricks, and techniques to help them not only survive but enjoy the holidays despite the onslaught of food that accompanies the holidays.   Knowledge Questionnaire Score: Knowledge Questionnaire Score - 12/30/18 1404      Knowledge Questionnaire Score   Pre Score  20/24       Core Components/Risk Factors/Patient Goals at Admission: Personal Goals and Risk Factors at Admission - 12/30/18 1459      Core Components/Risk Factors/Patient Goals on Admission    Weight Management  Yes;Obesity;Weight Maintenance;Weight Loss    Admit Weight  220 lb 0.3 oz (99.8 kg)    Expected Outcomes  Short Term: Continue to assess and modify interventions until short term weight is achieved;Long Term: Adherence to nutrition and physical activity/exercise program aimed toward attainment of established weight goal;Weight Maintenance: Understanding of the daily nutrition guidelines, which includes 25-35% calories from fat, 7% or less cal from saturated fats, less than 200mg  cholesterol, less than 1.5gm of sodium, & 5 or more servings of fruits and vegetables daily;Weight Loss: Understanding of general recommendations for a balanced deficit meal plan, which promotes 1-2 lb weight loss per week and includes a negative energy balance of [phone] kcal/d;Understanding recommendations for meals to include 15-35% energy as protein, 25-35% energy from fat, 35-60% energy from carbohydrates, less than 200mg   of dietary cholesterol, 20-35 gm of total fiber daily;Understanding of distribution of calorie intake throughout the day with the consumption of 4-5 meals/snacks    Hypertension  Yes    Intervention  Provide education on lifestyle modifcations including regular physical activity/exercise, weight management, moderate sodium restriction and increased consumption of fresh fruit, vegetables, and low fat dairy, alcohol moderation, and smoking cessation.;Monitor prescription use compliance.    Expected Outcomes  Short Term: Continued assessment and intervention until BP is < 140/24mm HG in hypertensive participants. < 130/32mm HG in hypertensive participants with diabetes, heart failure or chronic kidney disease.;Long Term: Maintenance of blood pressure at goal levels.    Lipids  Yes    Intervention  Provide education and support for participant on nutrition & aerobic/resistive exercise along with prescribed medications to achieve LDL 70mg , HDL >40mg .    Expected Outcomes  Short Term: Participant states understanding of desired cholesterol values and is compliant with medications prescribed. Participant is following exercise prescription and nutrition guidelines.;Long Term: Cholesterol controlled with medications as prescribed, with individualized exercise RX and with personalized nutrition plan. Value goals: LDL < 70mg , HDL > 40 mg.    Stress  Yes    Intervention  Offer individual and/or small group education and counseling on adjustment to heart disease, stress management and health-related lifestyle change. Teach and support self-help strategies.;Refer participants experiencing significant psychosocial distress to appropriate mental health specialists for further evaluation and treatment. When possible, include family members and significant others in education/counseling sessions.    Expected Outcomes  Short Term: Participant demonstrates changes in health-related behavior, relaxation and other stress  management skills, ability to obtain effective social support, and compliance with psychotropic medications if prescribed.;Long Term: Emotional wellbeing is indicated by absence of clinically significant psychosocial distress or social isolation.       Core Components/Risk Factors/Patient Goals Review:  Goals and Risk Factor Review    Row Name 01/20/19 1432 02/10/19  1646           Core Components/Risk Factors/Patient Goals Review   Personal Goals Review  Weight Management/Obesity;Lipids;Hypertension;Stress  Weight Management/Obesity;Lipids;Hypertension;Stress      Review  Archie is doing well with exercise. Ulrick has had some moderate exertional BP elevations. Will continue to monitor. Resting BP's WNL  Abhimanyu is doing well with exercise. Isador has had some moderate exertional BP elevations. Will continue to monitor BP's as Dr Eldridge Dace increased Mr Feeback's coreg      Expected Outcomes  Patient will continue to participate in phase 2 cardiac rehab for nutrition and lifestyle modifications.  Patient will continue to participate in phase 2 cardiac rehab for nutrition and lifestyle modifications.         Core Components/Risk Factors/Patient Goals at Discharge (Final Review):  Goals and Risk Factor Review - 02/10/19 1646      Core Components/Risk Factors/Patient Goals Review   Personal Goals Review  Weight Management/Obesity;Lipids;Hypertension;Stress    Review  Breanna is doing well with exercise. Bryler has had some moderate exertional BP elevations. Will continue to monitor BP's as Dr Eldridge Dace increased Mr Loya's coreg    Expected Outcomes  Patient will continue to participate in phase 2 cardiac rehab for nutrition and lifestyle modifications.       ITP Comments: ITP Comments    Row Name 12/30/18 1404 01/20/19 1431 02/10/19 1645       ITP Comments  Dr. Armanda Magic, Mediacl Director  30 Day ITP comments. Bowman is with good attendance and participation in phase 2 cardiac  rehab  30 Day ITP comments. Lennyn is with good attendance and participation in phase 2 cardiac rehab        Comments: See ITP comments.Gladstone Lighter, RN,BSN 02/17/2019 10:43 AM

## 2019-02-11 ENCOUNTER — Encounter (HOSPITAL_COMMUNITY)
Admission: RE | Admit: 2019-02-11 | Discharge: 2019-02-11 | Disposition: A | Discharge status: Home / Self Care | Payer: Medicare Other | Source: Ambulatory Visit | Attending: Interventional Cardiology | Admitting: Interventional Cardiology

## 2019-02-11 ENCOUNTER — Ambulatory Visit (HOSPITAL_COMMUNITY): Payer: BC Managed Care – PPO

## 2019-02-11 DIAGNOSIS — Z955 Presence of coronary angioplasty implant and graft: Secondary | ICD-10-CM | POA: Diagnosis not present

## 2019-02-14 ENCOUNTER — Ambulatory Visit (HOSPITAL_COMMUNITY): Payer: BC Managed Care – PPO

## 2019-02-14 ENCOUNTER — Encounter (HOSPITAL_COMMUNITY)
Admission: RE | Admit: 2019-02-14 | Discharge: 2019-02-14 | Disposition: A | Discharge status: Home / Self Care | Payer: Medicare Other | Source: Ambulatory Visit | Attending: Interventional Cardiology | Admitting: Interventional Cardiology

## 2019-02-14 DIAGNOSIS — Z955 Presence of coronary angioplasty implant and graft: Secondary | ICD-10-CM | POA: Diagnosis not present

## 2019-02-16 ENCOUNTER — Ambulatory Visit (HOSPITAL_COMMUNITY): Payer: BC Managed Care – PPO

## 2019-02-16 ENCOUNTER — Encounter (HOSPITAL_COMMUNITY)
Admission: RE | Admit: 2019-02-16 | Discharge: 2019-02-16 | Disposition: A | Discharge status: Home / Self Care | Payer: Medicare Other | Source: Ambulatory Visit | Attending: Interventional Cardiology | Admitting: Interventional Cardiology

## 2019-02-16 DIAGNOSIS — Z955 Presence of coronary angioplasty implant and graft: Secondary | ICD-10-CM | POA: Diagnosis not present

## 2019-02-18 ENCOUNTER — Encounter (HOSPITAL_COMMUNITY)
Admission: RE | Admit: 2019-02-18 | Discharge: 2019-02-18 | Disposition: A | Discharge status: Home / Self Care | Payer: Medicare Other | Source: Ambulatory Visit | Attending: Interventional Cardiology | Admitting: Interventional Cardiology

## 2019-02-18 ENCOUNTER — Ambulatory Visit (HOSPITAL_COMMUNITY): Payer: BC Managed Care – PPO

## 2019-02-18 DIAGNOSIS — Z955 Presence of coronary angioplasty implant and graft: Secondary | ICD-10-CM

## 2019-02-21 ENCOUNTER — Encounter (HOSPITAL_COMMUNITY)
Admission: RE | Admit: 2019-02-21 | Discharge: 2019-02-21 | Disposition: A | Discharge status: Home / Self Care | Payer: Medicare Other | Source: Ambulatory Visit | Attending: Interventional Cardiology | Admitting: Interventional Cardiology

## 2019-02-21 ENCOUNTER — Ambulatory Visit (HOSPITAL_COMMUNITY): Payer: BC Managed Care – PPO

## 2019-02-21 DIAGNOSIS — Z955 Presence of coronary angioplasty implant and graft: Secondary | ICD-10-CM

## 2019-02-23 ENCOUNTER — Ambulatory Visit (HOSPITAL_COMMUNITY): Payer: BC Managed Care – PPO

## 2019-02-23 ENCOUNTER — Encounter (HOSPITAL_COMMUNITY): Payer: Medicare Other

## 2019-02-25 ENCOUNTER — Encounter (HOSPITAL_COMMUNITY): Payer: Medicare Other

## 2019-02-25 ENCOUNTER — Ambulatory Visit (HOSPITAL_COMMUNITY): Payer: BC Managed Care – PPO

## 2019-02-28 ENCOUNTER — Encounter (HOSPITAL_COMMUNITY): Payer: Medicare Other

## 2019-02-28 ENCOUNTER — Ambulatory Visit (HOSPITAL_COMMUNITY): Payer: BC Managed Care – PPO

## 2019-02-28 ENCOUNTER — Telehealth (HOSPITAL_COMMUNITY): Payer: Self-pay | Admitting: *Deleted

## 2019-03-02 ENCOUNTER — Ambulatory Visit (HOSPITAL_COMMUNITY): Payer: BC Managed Care – PPO

## 2019-03-02 ENCOUNTER — Encounter (HOSPITAL_COMMUNITY): Payer: Medicare Other

## 2019-03-04 ENCOUNTER — Encounter (HOSPITAL_COMMUNITY): Payer: Self-pay | Admitting: *Deleted

## 2019-03-04 ENCOUNTER — Encounter (HOSPITAL_COMMUNITY): Payer: Medicare Other

## 2019-03-04 ENCOUNTER — Ambulatory Visit (HOSPITAL_COMMUNITY): Payer: BC Managed Care – PPO

## 2019-03-04 DIAGNOSIS — Z955 Presence of coronary angioplasty implant and graft: Secondary | ICD-10-CM

## 2019-03-07 ENCOUNTER — Ambulatory Visit (HOSPITAL_COMMUNITY): Payer: BC Managed Care – PPO

## 2019-03-07 ENCOUNTER — Encounter (HOSPITAL_COMMUNITY): Payer: Medicare Other

## 2019-03-09 ENCOUNTER — Ambulatory Visit (HOSPITAL_COMMUNITY): Payer: BC Managed Care – PPO

## 2019-03-09 ENCOUNTER — Encounter (HOSPITAL_COMMUNITY): Payer: Self-pay | Admitting: *Deleted

## 2019-03-09 ENCOUNTER — Encounter (HOSPITAL_COMMUNITY): Payer: Medicare Other

## 2019-03-09 DIAGNOSIS — Z955 Presence of coronary angioplasty implant and graft: Secondary | ICD-10-CM

## 2019-03-09 NOTE — Progress Notes (Signed)
Cardiac Individual Treatment Plan  Patient Details  Name: Matthew Henderson MRN: [id number] Date of Birth: [birth year] Referring Provider:     CARDIAC REHAB PHASE II ORIENTATION from 12/30/2018 in [facility] CARDIAC [facility]  Referring Provider  Dr.Varanasi      Initial Encounter Date:    CARDIAC REHAB PHASE II ORIENTATION from 12/30/2018 in [facility] CARDIAC REHAB  Date  12/30/18      Visit Diagnosis: Status post coronary artery stent placement,11/01/18 DES RCA  Patient's Home Medications on Admission:  Current Outpatient Medications:  .  amLODipine (NORVASC) 10 MG tablet, Take 1 tablet (10 mg total) by mouth daily., Disp: 90 tablet, Rfl: 3 .  aspirin EC 81 MG EC tablet, Take 1 tablet (81 mg total) by mouth daily., Disp: 30 tablet, Rfl: 10 .  atorvastatin (LIPITOR) 40 MG tablet, Take 1 tablet (40 mg total) by mouth daily at 6 PM., Disp: 90 tablet, Rfl: 3 .  carvedilol (COREG) 12.5 MG tablet, Take 1 tablet (12.5 mg total) by mouth 2 (two) times daily., Disp: 180 tablet, Rfl: 3 .  clopidogrel (PLAVIX) 75 MG tablet, Take 1 tablet (75 mg total) by mouth daily with breakfast., Disp: 90 tablet, Rfl: 3 .  ezetimibe (ZETIA) 10 MG tablet, Take 1 tablet (10 mg total) by mouth daily at 6 PM., Disp: 90 tablet, Rfl: 3 .  finasteride (PROSCAR) 5 MG tablet, Take 5 mg by mouth daily., Disp: , Rfl: 3 .  nitroGLYCERIN (NITROSTAT) 0.4 MG SL tablet, Place 1 tablet (0.4 mg total) under the tongue every 5 (five) minutes as needed for chest pain., Disp: 25 tablet, Rfl: 3 .  Omega-3 Fatty Acids (FISH OIL PO), Take 2 capsules by mouth daily., Disp: , Rfl:   Past Medical History: Past Medical History:  Diagnosis Date  . CAD S/P percutaneous coronary angioplasty    s/p DES to prox RCA, DES to distal RCA, 11/01/18, residual mild nonobs dz in mid RCA, mid LAD and prox LCx  . Hypercholesteremia   . Hypertension   . Prostate enlargement     Tobacco Use: Social History    Tobacco Use  Smoking Status Never Smoker  Smokeless Tobacco Never Used    Labs: Recent Review Advice worker    Labs for ITP Cardiac and Pulmonary Rehab Latest Ref Rng & Units 06/07/2013 11/02/2018   Cholestrol 0 - 200 mg/dL - 409   LDLCALC 0 - 99 mg/dL - 65   HDL >81 mg/dL - 19(J)   Trlycerides <478 mg/dL - 81   TCO2 0 - 295 mmol/L 22 -      Capillary Blood Glucose: No results found for: GLUCAP   Exercise Target Goals: Exercise Program Goal: Individual exercise prescription set using results from initial 6 min walk test and THRR while considering  patient's activity barriers and safety.   Exercise Prescription Goal: Initial exercise prescription builds to 30-45 minutes a day of aerobic activity, 2-3 days per week.  Home exercise guidelines will be given to patient during program as part of exercise prescription that the participant will acknowledge.  Activity Barriers & Risk Stratification:   6 Minute Walk:   Oxygen Initial Assessment:   Oxygen Re-Evaluation:   Oxygen Discharge (Final Oxygen Re-Evaluation):   Initial Exercise Prescription:   Perform Capillary Blood Glucose checks as needed.  Exercise Prescription Changes: Exercise Prescription Changes    Row Name 01/10/19 1453 01/24/19 1451 02/07/19 1450 02/21/19 1454       Response to Exercise  Blood Pressure (Admit)  142/80  118/82  118/62  122/62    Blood Pressure (Exercise)  168/82  162/80  162/88  182/80    Blood Pressure (Exit)  118/72  130/68  112/60  120/62    Heart Rate (Admit)  74 bpm  81 bpm  71 bpm  73 bpm    Heart Rate (Exercise)  111 bpm  114 bpm  106 bpm  107 bpm    Heart Rate (Exit)  73 bpm  80 bpm  71 bpm  73 bpm    Rating of Perceived Exertion (Exercise)  12  13  13  12     Symptoms  none  none  none  none    Duration  Progress to 30 minutes of  aerobic without signs/symptoms of physical distress  Progress to 30 minutes of  aerobic without signs/symptoms of physical distress  Progress  to 30 minutes of  aerobic without signs/symptoms of physical distress  Progress to 30 minutes of  aerobic without signs/symptoms of physical distress    Intensity  THRR unchanged  THRR unchanged  THRR unchanged  THRR unchanged      Progression   Progression  Continue to progress workloads to maintain intensity without signs/symptoms of physical distress.  Continue to progress workloads to maintain intensity without signs/symptoms of physical distress.  Continue to progress workloads to maintain intensity without signs/symptoms of physical distress.  Continue to progress workloads to maintain intensity without signs/symptoms of physical distress.    Average METs  3.6  3.8  4.1  4.2      Resistance Training   Training Prescription  Yes  Yes  Yes  Yes    Weight  3lbs  4lbs  5lbs  5lbs    Reps  10-15  10-15  10-15  10-15    Time  10 Minutes  10 Minutes  10 Minutes  10 Minutes      Interval Training   Interval Training  No  No  No  No      Bike   Level  1.8  1.8  2.1  2.1    Minutes  10  10  10  10     METs  4.39  4.42  5.03  4.99      NuStep   Level  3  4  4  5     SPM  85  85  85  85    Minutes  10  10  10  10     METs  3  3.5  4.1  4.4      Track   Laps  13  15  13  13     Minutes  10  10  10  10     METs  3.26  3.6  3.26  3.26      Home Exercise Plan   Plans to continue exercise at  -  Home (comment) Walking  Home (comment) Walking  Home (comment) Walking    Frequency  -  Add 1 additional day to program exercise sessions.  Add 1 additional day to program exercise sessions.  Add 1 additional day to program exercise sessions.    Initial Home Exercises Provided  -  01/14/19  01/14/19  01/14/19       Exercise Comments: Exercise Comments    Row Name 01/10/19 1509 01/14/19 1506 01/24/19 1520 02/07/19 1522 02/21/19 1519   Exercise Comments  Reviewed METs with patient.  Reviewed home exercise guidelines, METs, and goals  with patient.  Reviewed METs and goals with patient.  METs reviewed  with patient.  Reviewed METs and goals with patient.      Exercise Goals and Review:   Exercise Goals Re-Evaluation : Exercise Goals Re-Evaluation    Row Name 01/14/19 1506 01/24/19 1520 02/21/19 1519 03/02/19 1512       Exercise Goal Re-Evaluation   Exercise Goals Review  Increase Physical Activity;Able to understand and use rate of perceived exertion (RPE) scale;Understanding of Exercise Prescription;Knowledge and understanding of Target Heart Rate Range (THRR);Able to check pulse independently  Increase Physical Activity;Able to understand and use rate of perceived exertion (RPE) scale;Understanding of Exercise Prescription;Knowledge and understanding of Target Heart Rate Range (THRR);Able to check pulse independently  Increase Physical Activity;Able to understand and use rate of perceived exertion (RPE) scale;Understanding of Exercise Prescription;Knowledge and understanding of Target Heart Rate Range (THRR);Able to check pulse independently  -    Comments  Reviewed home exercise guidelines with patient including THRR, RPE scale, and endpoints for exercise. Pt plans to walk as his mode of home exericse. Pt knows how to check his pulse.  Patient not consistently walking at home. Encouraged patient to add 1-2 days/walking at home to help achieve personal weight loss goal.  Patient is doing well with exercise, some elevated blood pressure readings on the stationary bike. Pt feels that his exercise routine is going really well. Pt is back to playing golf without any issues.   Temporary department closure due to COVID-19.    Expected Outcomes  Patient will walk at least 30 minutes, 2 days per week in addition to exercise at cardiac rehab.  Patient will walk 1-2 days/week at home to help achieve goal of losing weight.  Progress workloads to help improve functional capacity.  -       Discharge Exercise Prescription (Final Exercise Prescription Changes): Exercise Prescription Changes - 02/21/19 1454       Response to Exercise   Blood Pressure (Admit)  122/62    Blood Pressure (Exercise)  182/80    Blood Pressure (Exit)  120/62    Heart Rate (Admit)  73 bpm    Heart Rate (Exercise)  107 bpm    Heart Rate (Exit)  73 bpm    Rating of Perceived Exertion (Exercise)  12    Symptoms  none    Duration  Progress to 30 minutes of  aerobic without signs/symptoms of physical distress    Intensity  THRR unchanged      Progression   Progression  Continue to progress workloads to maintain intensity without signs/symptoms of physical distress.    Average METs  4.2      Resistance Training   Training Prescription  Yes    Weight  5lbs    Reps  10-15    Time  10 Minutes      Interval Training   Interval Training  No      Bike   Level  2.1    Minutes  10    METs  4.99      NuStep   Level  5    SPM  85    Minutes  10    METs  4.4      Track   Laps  13    Minutes  10    METs  3.26      Home Exercise Plan   Plans to continue exercise at  Home (comment)   Walking   Frequency  Add 1  additional day to program exercise sessions.    Initial Home Exercises Provided  01/14/19       Nutrition:  Target Goals: Understanding of nutrition guidelines, daily intake of sodium 1500mg , cholesterol 200mg , calories 30% from fat and 7% or less from saturated fats, daily to have 5 or more servings of fruits and vegetables.  Biometrics:    Nutrition Therapy Plan and Nutrition Goals:   Nutrition Assessments:   Nutrition Goals Re-Evaluation:   Nutrition Goals Re-Evaluation:   Nutrition Goals Discharge (Final Nutrition Goals Re-Evaluation):   Psychosocial: Target Goals: Acknowledge presence or absence of significant depression and/or stress, maximize coping skills, provide positive support system. Participant is able to verbalize types and ability to use techniques and skills needed for reducing stress and depression.  Initial Review & Psychosocial Screening:   Quality of Life  Scores:  Scores of 19 and below usually indicate a poorer quality of life in these areas.  A difference of  2-3 points is a clinically meaningful difference.  A difference of 2-3 points in the total score of the Quality of Life Index has been associated with significant improvement in overall quality of life, self-image, physical symptoms, and general health in studies assessing change in quality of life.  PHQ-9: Recent Review Flowsheet Data    Depression screen [facility] 2/9 01/03/2019   Decreased Interest 0   Down, Depressed, Hopeless 0   PHQ - 2 Score 0     Interpretation of Total Score  Total Score Depression Severity:  1-4 = Minimal depression, 5-9 = Mild depression, 10-14 = Moderate depression, 15-19 = Moderately severe depression, 20-27 = Severe depression   Psychosocial Evaluation and Intervention:   Psychosocial Re-Evaluation: Psychosocial Re-Evaluation    Row Name 01/20/19 1432 02/10/19 1646 03/04/19 1442         Psychosocial Re-Evaluation   Current issues with  None Identified  None Identified  None Identified     Comments  -  -  unable to reassess as cardiac rehab is currently is on hold     Interventions  Encouraged to attend Cardiac Rehabilitation for the exercise  Encouraged to attend Cardiac Rehabilitation for the exercise  -     Continue Psychosocial Services   No Follow up required  No Follow up required  -        Psychosocial Discharge (Final Psychosocial Re-Evaluation): Psychosocial Re-Evaluation - 03/04/19 1442      Psychosocial Re-Evaluation   Current issues with  None Identified    Comments  unable to reassess as cardiac rehab is currently is on hold       Vocational Rehabilitation: Provide vocational rehab assistance to qualifying candidates.   Vocational Rehab Evaluation & Intervention:   Education: Education Goals: Education classes will be provided on a weekly basis, covering required topics. Participant will state understanding/return  demonstration of topics presented.  Learning Barriers/Preferences:   Education Topics: Count Your Pulse:  -Group instruction provided by verbal instruction, demonstration, patient participation and written materials to support subject.  Instructors address importance of being able to find your pulse and how to count your pulse when at home without a heart monitor.  Patients get hands on experience counting their pulse with staff help and individually.   CARDIAC REHAB PHASE II EXERCISE from 01/07/2019 in [facility] CARDIAC REHAB  Date  01/07/19  Instruction Review Code  2- Demonstrated Understanding      Heart Attack, Angina, and Risk Factor Modification:  -Group instruction provided by verbal  instruction, video, and written materials to support subject.  Instructors address signs and symptoms of angina and heart attacks.    Also discuss risk factors for heart disease and how to make changes to improve heart health risk factors.   Functional Fitness:  -Group instruction provided by verbal instruction, demonstration, patient participation, and written materials to support subject.  Instructors address safety measures for doing things around the house.  Discuss how to get up and down off the floor, how to pick things up properly, how to safely get out of a chair without assistance, and balance training.   CARDIAC REHAB PHASE II EXERCISE from 02/09/2019 in [facility] CARDIAC REHAB  Date  02/04/19  Educator  EP  Instruction Review Code  2- Demonstrated Understanding      Meditation and Mindfulness:  -Group instruction provided by verbal instruction, patient participation, and written materials to support subject.  Instructor addresses importance of mindfulness and meditation practice to help reduce stress and improve awareness.  Instructor also leads participants through a meditation exercise.    Stretching for Flexibility and Mobility:  -Group instruction  provided by verbal instruction, patient participation, and written materials to support subject.  Instructors lead participants through series of stretches that are designed to increase flexibility thus improving mobility.  These stretches are additional exercise for major muscle groups that are typically performed during regular warm up and cool down.   Hands Only CPR:  -Group verbal, video, and participation provides a basic overview of AHA guidelines for community CPR. Role-play of emergencies allow participants the opportunity to practice calling for help and chest compression technique with discussion of AED use.   Hypertension: -Group verbal and written instruction that provides a basic overview of hypertension including the most recent diagnostic guidelines, risk factor reduction with self-care instructions and medication management.   CARDIAC REHAB PHASE II EXERCISE from 02/09/2019 in [facility] CARDIAC REHAB  Date  01/21/19  Educator  RN  Instruction Review Code  2- Demonstrated Understanding       Nutrition I class: Heart Healthy Eating:  -Group instruction provided by PowerPoint slides, verbal discussion, and written materials to support subject matter. The instructor gives an explanation and review of the Therapeutic Lifestyle Changes diet recommendations, which includes a discussion on lipid goals, dietary fat, sodium, fiber, plant stanol/sterol esters, sugar, and the components of a well-balanced, healthy diet.   Nutrition II class: Lifestyle Skills:  -Group instruction provided by PowerPoint slides, verbal discussion, and written materials to support subject matter. The instructor gives an explanation and review of label reading, grocery shopping for heart health, heart healthy recipe modifications, and ways to make healthier choices when eating out.   Diabetes Question & Answer:  -Group instruction provided by PowerPoint slides, verbal discussion, and  written materials to support subject matter. The instructor gives an explanation and review of diabetes co-morbidities, pre- and post-prandial blood glucose goals, pre-exercise blood glucose goals, signs, symptoms, and treatment of hypoglycemia and hyperglycemia, and foot care basics.   Diabetes Blitz:  -Group instruction provided by PowerPoint slides, verbal discussion, and written materials to support subject matter. The instructor gives an explanation and review of the physiology behind type 1 and type 2 diabetes, diabetes medications and rational behind using different medications, pre- and post-prandial blood glucose recommendations and Hemoglobin A1c goals, diabetes diet, and exercise including blood glucose guidelines for exercising safely.    Portion Distortion:  -Group instruction provided by PowerPoint slides, verbal discussion, written materials,  and food models to support subject matter. The instructor gives an explanation of serving size versus portion size, changes in portions sizes over the last 20 years, and what consists of a serving from each food group.   Stress Management:  -Group instruction provided by verbal instruction, video, and written materials to support subject matter.  Instructors review role of stress in heart disease and how to cope with stress positively.     CARDIAC REHAB PHASE II EXERCISE from 02/09/2019 in [facility] CARDIAC REHAB  Date  01/12/19  Educator  RN  Instruction Review Code  2- Demonstrated Understanding      Exercising on Your Own:  -Group instruction provided by verbal instruction, power point, and written materials to support subject.  Instructors discuss benefits of exercise, components of exercise, frequency and intensity of exercise, and end points for exercise.  Also discuss use of nitroglycerin and activating EMS.  Review options of places to exercise outside of rehab.  Review guidelines for sex with heart disease.    CARDIAC REHAB PHASE II EXERCISE from 02/09/2019 in [facility] CARDIAC REHAB  Date  02/09/19  Instruction Review Code  2- Demonstrated Understanding      Cardiac Drugs I:  -Group instruction provided by verbal instruction and written materials to support subject.  Instructor reviews cardiac drug classes: antiplatelets, anticoagulants, beta blockers, and statins.  Instructor discusses reasons, side effects, and lifestyle considerations for each drug class.   Cardiac Drugs II:  -Group instruction provided by verbal instruction and written materials to support subject.  Instructor reviews cardiac drug classes: angiotensin converting enzyme inhibitors (ACE-I), angiotensin II receptor blockers (ARBs), nitrates, and calcium channel blockers.  Instructor discusses reasons, side effects, and lifestyle considerations for each drug class.   CARDIAC REHAB PHASE II EXERCISE from 02/09/2019 in [facility] CARDIAC REHAB  Date  01/26/19  Instruction Review Code  2- Demonstrated Understanding      Anatomy and Physiology of the Circulatory System:  Group verbal and written instruction and models provide basic cardiac anatomy and physiology, with the coronary electrical and arterial systems. Review of: AMI, Angina, Valve disease, Heart Failure, Peripheral Artery Disease, Cardiac Arrhythmia, Pacemakers, and the ICD.   CARDIAC REHAB PHASE II EXERCISE from 02/09/2019 in [facility] CARDIAC REHAB  Date  01/19/19  Instruction Review Code  2- Demonstrated Understanding      Other Education:  -Group or individual verbal, written, or video instructions that support the educational goals of the cardiac rehab program.   Holiday Eating Survival Tips:  -Group instruction provided by PowerPoint slides, verbal discussion, and written materials to support subject matter. The instructor gives patients tips, tricks, and techniques to help them not only survive but enjoy  the holidays despite the onslaught of food that accompanies the holidays.   Knowledge Questionnaire Score:   Core Components/Risk Factors/Patient Goals at Admission:   Core Components/Risk Factors/Patient Goals Review:  Goals and Risk Factor Review    Row Name 01/20/19 1432 02/10/19 1646 03/04/19 1443         Core Components/Risk Factors/Patient Goals Review   Personal Goals Review  Weight Management/Obesity;Lipids;Hypertension;Stress  Weight Management/Obesity;Lipids;Hypertension;Stress  Weight Management/Obesity;Lipids;Hypertension;Stress     Review  Aziel is doing well with exercise. Coleton has had some moderate exertional BP elevations. Will continue to monitor. Resting BP's WNL  Andrews is doing well with exercise. Kaidan has had some moderate exertional BP elevations. Will continue to monitor BP's as Dr Eldridge Dace increased  Mr Sadowsky's coreg  Exercise is currently on hold as the department is closed per reccomended guidelines from the [employer] to prevent the spread of COVID-19     Expected Outcomes  Patient will continue to participate in phase 2 cardiac rehab for nutrition and lifestyle modifications.  Patient will continue to participate in phase 2 cardiac rehab for nutrition and lifestyle modifications.  Patient will continue to participate in phase 2 cardiac rehab for nutrition and lifestyle modifications once cardiac rehab resumes        Core Components/Risk Factors/Patient Goals at Discharge (Final Review):  Goals and Risk Factor Review - 03/04/19 1443      Core Components/Risk Factors/Patient Goals Review   Personal Goals Review  Weight Management/Obesity;Lipids;Hypertension;Stress    Review  Exercise is currently on hold as the department is closed per reccomended guidelines from the [employer] to prevent the spread of COVID-19    Expected Outcomes  Patient will continue to participate in phase 2 cardiac rehab for nutrition and lifestyle modifications  once cardiac rehab resumes       ITP Comments: ITP Comments    Row Name 01/20/19 1431 02/10/19 1645 03/04/19 1441       ITP Comments  30 Day ITP comments. Jvonte is with good attendance and participation in phase 2 cardiac rehab  30 Day ITP comments. Nathyn is with good attendance and participation in phase 2 cardiac rehab  30 Day ITP comments. Exercise is currently on hold as the department is closed per reccomended guidelines from the [employer] to prevent the spread of COVID-19        Comments: See ITP comments.Gladstone Lighter, RN,BSN 03/09/2019 10:06 AM

## 2019-03-11 ENCOUNTER — Ambulatory Visit (HOSPITAL_COMMUNITY): Payer: BC Managed Care – PPO

## 2019-03-11 ENCOUNTER — Encounter (HOSPITAL_COMMUNITY): Payer: Medicare Other

## 2019-03-14 ENCOUNTER — Ambulatory Visit (HOSPITAL_COMMUNITY): Payer: BC Managed Care – PPO

## 2019-03-14 ENCOUNTER — Encounter (HOSPITAL_COMMUNITY): Payer: Medicare Other

## 2019-03-16 ENCOUNTER — Ambulatory Visit (HOSPITAL_COMMUNITY): Payer: BC Managed Care – PPO

## 2019-03-16 ENCOUNTER — Encounter (HOSPITAL_COMMUNITY): Payer: Medicare Other

## 2019-03-18 ENCOUNTER — Encounter (HOSPITAL_COMMUNITY): Payer: Medicare Other

## 2019-03-18 ENCOUNTER — Ambulatory Visit (HOSPITAL_COMMUNITY): Payer: BC Managed Care – PPO

## 2019-03-21 ENCOUNTER — Encounter (HOSPITAL_COMMUNITY): Payer: Medicare Other

## 2019-03-21 ENCOUNTER — Ambulatory Visit (HOSPITAL_COMMUNITY): Payer: BC Managed Care – PPO

## 2019-03-23 ENCOUNTER — Encounter (HOSPITAL_COMMUNITY): Payer: Medicare Other

## 2019-03-23 ENCOUNTER — Ambulatory Visit (HOSPITAL_COMMUNITY): Payer: BC Managed Care – PPO

## 2019-03-25 ENCOUNTER — Encounter (HOSPITAL_COMMUNITY): Payer: Medicare Other

## 2019-03-25 ENCOUNTER — Telehealth (HOSPITAL_COMMUNITY): Payer: Self-pay | Admitting: *Deleted

## 2019-03-25 ENCOUNTER — Ambulatory Visit (HOSPITAL_COMMUNITY): Payer: BC Managed Care – PPO

## 2019-03-25 NOTE — Telephone Encounter (Signed)
Called to notify patient that the cardiac and pulmonary rehabilitation department remains closed at this time due to COVID-19 restrictions. Pt verbalized understanding. Pt is walking and playing golf to remain active at this time.  Artist Pais, MS, ACSM CEP 03/25/2019 1302

## 2019-03-28 ENCOUNTER — Ambulatory Visit (HOSPITAL_COMMUNITY): Payer: BC Managed Care – PPO

## 2019-03-28 ENCOUNTER — Encounter (HOSPITAL_COMMUNITY): Payer: Medicare Other

## 2019-03-30 ENCOUNTER — Ambulatory Visit (HOSPITAL_COMMUNITY): Payer: BC Managed Care – PPO

## 2019-03-30 ENCOUNTER — Encounter (HOSPITAL_COMMUNITY): Payer: Medicare Other

## 2019-04-01 ENCOUNTER — Ambulatory Visit (HOSPITAL_COMMUNITY): Payer: BC Managed Care – PPO

## 2019-04-01 ENCOUNTER — Encounter (HOSPITAL_COMMUNITY): Payer: Medicare Other

## 2019-04-04 ENCOUNTER — Encounter (HOSPITAL_COMMUNITY): Payer: Medicare Other

## 2019-04-04 ENCOUNTER — Ambulatory Visit (HOSPITAL_COMMUNITY): Payer: BC Managed Care – PPO

## 2019-04-06 ENCOUNTER — Encounter (HOSPITAL_COMMUNITY): Payer: Medicare Other

## 2019-04-06 ENCOUNTER — Inpatient Hospital Stay (HOSPITAL_COMMUNITY)
Admission: RE | Admit: 2019-04-06 | Discharge: 2019-04-06 | Disposition: A | Discharge status: Home / Self Care | Payer: BC Managed Care – PPO | Source: Ambulatory Visit

## 2019-04-06 DIAGNOSIS — Z955 Presence of coronary angioplasty implant and graft: Secondary | ICD-10-CM

## 2019-05-11 IMAGING — CR DG CHEST 2V
2 series · 2 of 2 positions shown · non-contrast
Comparison: 05/27/2015

CLINICAL DATA: Chest pain

EXAM:
CHEST - 2 VIEW

[w chest pa]
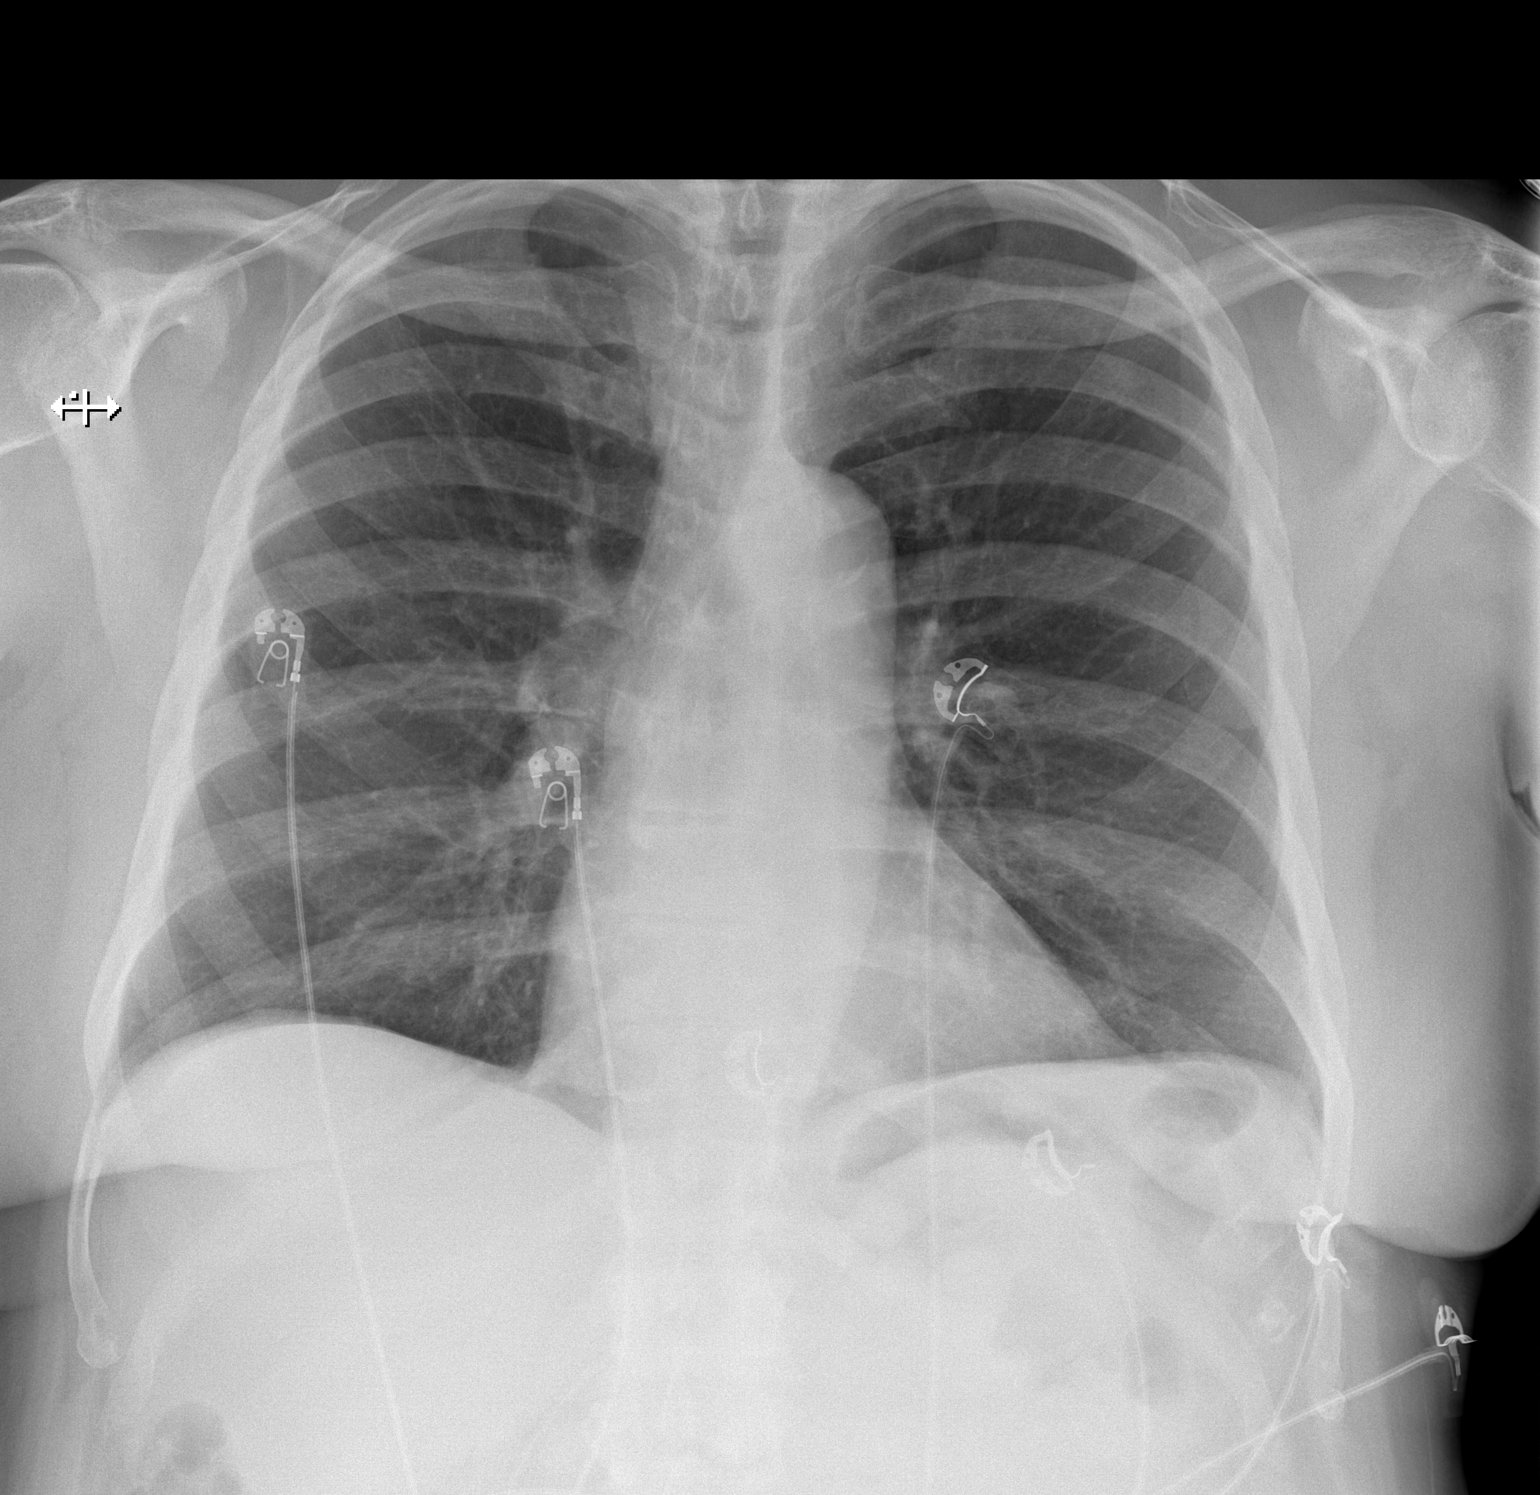

[w chest lat]
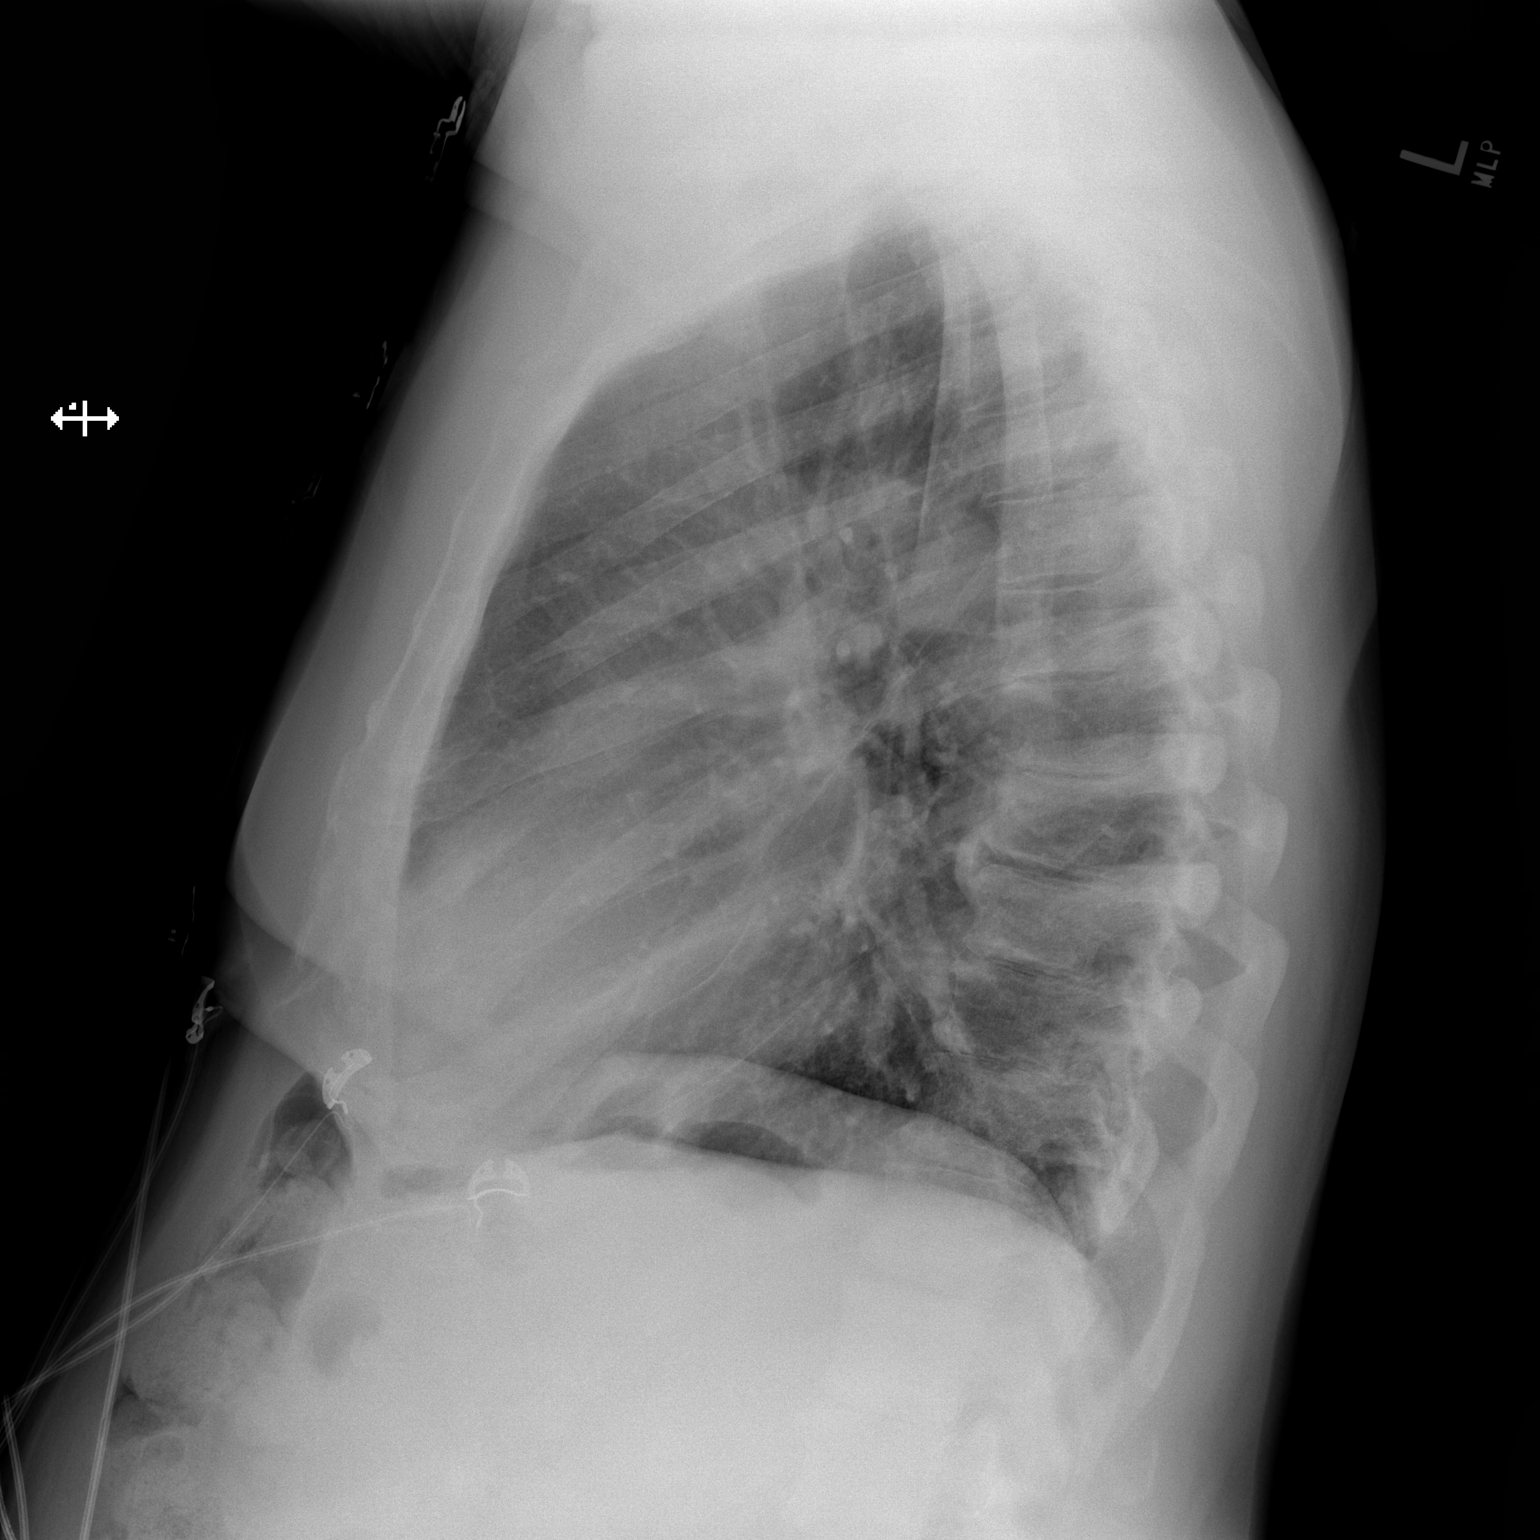

[2 of 2 positions shown; findings below may reference images not displayed]

FINDINGS: Heart and mediastinal contours are within normal limits. No focal
opacities or effusions. No acute bony abnormality.
IMPRESSION: No active cardiopulmonary disease.

## 2019-05-12 ENCOUNTER — Telehealth (HOSPITAL_COMMUNITY): Payer: Self-pay | Admitting: *Deleted

## 2019-05-12 ENCOUNTER — Telehealth (HOSPITAL_COMMUNITY): Payer: Self-pay

## 2019-05-12 NOTE — Telephone Encounter (Signed)
Phone call made to Pt to provide information about virtual cardiac rehab. Pt did not answer. Message was left for Pt to return call.  

## 2019-05-12 NOTE — Telephone Encounter (Signed)
Please contact letter in regards to virtual cardiac rehab sent to address on file in epic. Await response, if no response by June 12th will discharge pt from the program.  Carlette Carlton RN, BSN Cardiac and Pulmonary Rehab Nurse Navigator   

## 2019-06-01 ENCOUNTER — Telehealth (HOSPITAL_COMMUNITY): Payer: Self-pay | Admitting: *Deleted

## 2019-06-01 NOTE — Progress Notes (Signed)
Discharge Progress Report  Patient Details  Name: Matthew DressMichael W Plaza MRN: [id number] Date of Birth: 06/17/1946 Referring Provider:     CARDIAC REH[facility] PH[facility]  Referring Provider  Dr.Varanasi       Number of Visits: 23  Reason for Discharge:  Casimiro NeedleMichael is exercising on his own. Cardiac Rehab has been closed due to the COVID 19 Pandemic  Smoking History:  Social History   Tobacco Use  Smoking Status Never Smoker  Smokeless Tobacco Never Used    Diagnosis:  Status post coronary artery stent placement,11/01/18 DES RCA  ADL [facility]:   Initial Exercise Prescription: Initial Exercise Prescription - 12/30/18 1400      Date of Initial Exercise RX and Referring Provider   Date  12/30/18    Referring Provider  Dr.Varanasi    Expected Discharge Date  04/08/19      Bike   Level  1    Minutes  10    METs  2.89      NuStep   Level  3    SPM  85    Minutes  10    METs  3      Track   Laps  11    Minutes  10    METs  2.91      Prescription Details   Frequency (times per week)  3    Duration  Progress to 30 minutes of continuous aerobic without signs/symptoms of physical distress      Intensity   THRR 40-80% of Max Heartrate  60-120    Ratings of Perceived Exertion  11-13      Progression   Progression  Continue to progress workloads to maintain intensity without signs/symptoms of physical distress.      Resistance Training   Training Prescription  Yes    Weight  3 lbs.     Reps  10-15       Discharge Exercise Prescription (Final Exercise Prescription Changes): Exercise Prescription Changes - 02/21/19 1454      Response to Exercise   Blood Pressure (Admit)  122/62    Blood Pressure (Exercise)  182/80    Blood Pressure (Exit)  120/62    Heart Rate (Admit)  73 bpm    Heart Rate (Exercise)  107 bpm    Heart Rate (Exit)  73 bpm    Rating of Perceived Exertion (Exercise)  12    Symptoms  none     Duration  Progress to 30 minutes of  aerobic without signs/symptoms of physical distress    Intensity  THRR unchanged      Progression   Progression  Continue to progress workloads to maintain intensity without signs/symptoms of physical distress.    Average METs  4.2      Resistance Training   Training Prescription  Yes    Weight  5lbs    Reps  10-15    Time  10 Minutes      Interval Training   Interval Training  No      Bike   Level  2.1    Minutes  10    METs  4.99      NuStep   Level  5    SPM  85    Minutes  10    METs  4.4      Track   Laps  13    Minutes  10    METs  3.26      Home Exercise Plan   Plans to continue exercise at  Home (comment)   Walking   Frequency  Add 1 additional day to program exercise sessions.    Initial Home Exercises Provided  01/14/19       Functional Capacity: 6 Minute Walk    Row Name 12/30/18 1453         6 Minute Walk   Phase  Initial     Distance  1612 feet     Walk Time  6 minutes     # of Rest Breaks  0     MPH  3.1     METS  3.42     RPE  11     VO2 Peak  11.96     Symptoms  No     Resting HR  75 bpm     Resting BP  124/62     Resting Oxygen Saturation   98 %     Exercise Oxygen Saturation  during 6 min walk  98 %     Max Ex. HR  116 bpm     Max Ex. BP  130/68     2 Minute Post BP  118/72        Psychological, QOL, Others - Outcomes: PHQ 2/9: Depression screen PHQ 2/9 01/03/2019  Decreased Interest 0  Down, Depressed, Hopeless 0  PHQ - 2 Score 0    Quality of Life: Quality of Life - 12/30/18 1457      Quality of Life   Select  Quality of Life      Quality of Life Scores   Health/Function Pre  28.8 %    Socioeconomic Pre  30 %    Psych/Spiritual Pre  30 %    Family Pre  28.5 %    GLOBAL Pre  29.23 %       Personal Goals: Goals established at orientation with interventions provided to work toward goal. Personal Goals and Risk Factors at Admission - 12/30/18 1459      Core  Components/Risk Factors/Patient Goals on Admission    Weight Management  Yes;Obesity;Weight Maintenance;Weight Loss    Admit Weight  220 lb 0.3 oz (99.8 kg)    Expected Outcomes  Short Term: Continue to assess and modify interventions until short term weight is achieved;Long Term: Adherence to nutrition and physical activity/exercise program aimed toward attainment of established weight goal;Weight Maintenance: Understanding of the daily nutrition guidelines, which includes 25-35% calories from fat, 7% or less cal from saturated fats, less than 200mg  cholesterol, less than 1.5gm of sodium, & 5 or more servings of fruits and vegetables daily;Weight Loss: Understanding of general recommendations for a balanced deficit meal plan, which promotes 1-2 lb weight loss per week and includes a negative energy balance of [phone] kcal/d;Understanding recommendations for meals to include 15-35% energy as protein, 25-35% energy from fat, 35-60% energy from carbohydrates, less than 200mg  of dietary cholesterol, 20-35 gm of total fiber daily;Understanding of distribution of calorie intake throughout the day with the consumption of 4-5 meals/snacks    Hypertension  Yes    Intervention  Provide education on lifestyle modifcations including regular physical activity/exercise, weight management, moderate sodium restriction and increased consumption of fresh fruit, vegetables, and low fat dairy, alcohol moderation, and smoking cessation.;Monitor prescription use compliance.    Expected Outcomes  Short Term: Continued assessment and intervention until BP is < 140/5090mm HG in hypertensive participants. < 130/5580mm HG in hypertensive participants with  diabetes, heart failure or chronic kidney disease.;Long Term: Maintenance of blood pressure at goal levels.    Lipids  Yes    Intervention  Provide education and support for participant on nutrition & aerobic/resistive exercise along with prescribed medications to achieve LDL 70mg ,  HDL >40mg .    Expected Outcomes  Short Term: Participant states understanding of desired cholesterol values and is compliant with medications prescribed. Participant is following exercise prescription and nutrition guidelines.;Long Term: Cholesterol controlled with medications as prescribed, with individualized exercise RX and with personalized nutrition plan. Value goals: LDL < 70mg , HDL > 40 mg.    Stress  Yes    Intervention  Offer individual and/or small group education and counseling on adjustment to heart disease, stress management and health-related lifestyle change. Teach and support self-help strategies.;Refer participants experiencing significant psychosocial distress to appropriate mental health specialists for further evaluation and treatment. When possible, include family members and significant others in education/counseling sessions.    Expected Outcomes  Short Term: Participant demonstrates changes in health-related behavior, relaxation and other stress management skills, ability to obtain effective social support, and compliance with psychotropic medications if prescribed.;Long Term: Emotional wellbeing is indicated by absence of clinically significant psychosocial distress or social isolation.        Personal Goals Discharge: Goals and Risk Factor Review    Row Name 01/20/19 1432 02/10/19 1646 03/04/19 1443 06/01/19 1334       Core Components/Risk Factors/Patient Goals Review   Personal Goals Review  Weight Management/Obesity;Lipids;Hypertension;Stress  Weight Management/Obesity;Lipids;Hypertension;Stress  Weight Management/Obesity;Lipids;Hypertension;Stress  Weight Management/Obesity    Review  Aadin is doing well with exercise. Mena has had some moderate exertional BP elevations. Will continue to monitor. Resting BP's WNL  Meyer is doing well with exercise. Daniela has had some moderate exertional BP elevations. Will continue to monitor BP's as Dr Irish Lack increased Mr  Pinon's coreg  Exercise is currently on hold as the department is closed per reccomended guidelines from the [employer] to prevent the spread of COVID-19  Exercise is currently on hold as the department is closed per reccomended guidelines from the [employer] to prevent the spread of COVID-19.    Expected Outcomes  Patient will continue to participate in phase 2 cardiac rehab for nutrition and lifestyle modifications.  Patient will continue to participate in phase 2 cardiac rehab for nutrition and lifestyle modifications.  Patient will continue to participate in phase 2 cardiac rehab for nutrition and lifestyle modifications once cardiac rehab resumes  Abrham is exercising on his own and is not interested in virtual cardiac rehab at this time and would like to be discharged from phase 2 cardiac rehab.       Exercise Goals and Review: Exercise Goals    Row Name 12/30/18 1408             Exercise Goals   Increase Physical Activity  Yes       Intervention  Provide advice, education, support and counseling about physical activity/exercise needs.;Develop an individualized exercise prescription for aerobic and resistive training based on initial evaluation findings, risk stratification, comorbidities and participant's personal goals.       Expected Outcomes  Short Term: Attend rehab on a regular basis to increase amount of physical activity.;Long Term: Exercising regularly at least 3-5 days a week.;Long Term: Add in home exercise to make exercise part of routine and to increase amount of physical activity.       Increase Strength and Stamina  Yes  Intervention  Provide advice, education, support and counseling about physical activity/exercise needs.;Develop an individualized exercise prescription for aerobic and resistive training based on initial evaluation findings, risk stratification, comorbidities and participant's personal goals.       Expected Outcomes  Short Term: Increase  workloads from initial exercise prescription for resistance, speed, and METs.;Short Term: Perform resistance training exercises routinely during rehab and add in resistance training at home;Long Term: Improve cardiorespiratory fitness, muscular endurance and strength as measured by increased METs and functional capacity (6MWT)       Able to understand and use rate of perceived exertion (RPE) scale  Yes       Intervention  Provide education and explanation on how to use RPE scale       Expected Outcomes  Short Term: Able to use RPE daily in rehab to express subjective intensity level;Long Term:  Able to use RPE to guide intensity level when exercising independently       Knowledge and understanding of Target Heart Rate Range (THRR)  Yes       Intervention  Provide education and explanation of THRR including how the numbers were predicted and where they are located for reference       Expected Outcomes  Short Term: Able to state/look up THRR;Long Term: Able to use THRR to govern intensity when exercising independently;Short Term: Able to use daily as guideline for intensity in rehab       Able to check pulse independently  Yes       Intervention  Provide education and demonstration on how to check pulse in carotid and radial arteries.;Review the importance of being able to check your own pulse for safety during independent exercise       Expected Outcomes  Short Term: Able to explain why pulse checking is important during independent exercise;Long Term: Able to check pulse independently and accurately       Understanding of Exercise Prescription  Yes       Intervention  Provide education, explanation, and written materials on patient's individual exercise prescription       Expected Outcomes  Short Term: Able to explain program exercise prescription;Long Term: Able to explain home exercise prescription to exercise independently          Exercise Goals Re-Evaluation: Exercise Goals Re-Evaluation     Row Name 01/05/19 1541 01/14/19 1506 01/24/19 1520 02/21/19 1519 03/02/19 1512     Exercise Goal Re-Evaluation   Exercise Goals Review  Increase Physical Activity;Able to understand and use rate of perceived exertion (RPE) scale  Increase Physical Activity;Able to understand and use rate of perceived exertion (RPE) scale;Understanding of Exercise Prescription;Knowledge and understanding of Target Heart Rate Range (THRR);Able to check pulse independently  Increase Physical Activity;Able to understand and use rate of perceived exertion (RPE) scale;Understanding of Exercise Prescription;Knowledge and understanding of Target Heart Rate Range (THRR);Able to check pulse independently  Increase Physical Activity;Able to understand and use rate of perceived exertion (RPE) scale;Understanding of Exercise Prescription;Knowledge and understanding of Target Heart Rate Range (THRR);Able to check pulse independently  -   Comments  Patient able to understand and use RPE scale appropriately.  Reviewed home exercise guidelines with patient including THRR, RPE scale, and endpoints for exercise. Pt plans to walk as his mode of home exericse. Pt knows how to check his pulse.  Patient not consistently walking at home. Encouraged patient to add 1-2 days/walking at home to help achieve personal weight loss goal.  Patient is doing well with  exercise, some elevated blood pressure readings on the stationary bike. Pt feels that his exercise routine is going really well. Pt is back to playing golf without any issues.   Temporary department closure due to COVID-19.   Expected Outcomes  Increase workloads as tolerated to help achieve personal health and fitness goals.  Patient will walk at least 30 minutes, 2 days per week in addition to exercise at cardiac rehab.  Patient will walk 1-2 days/week at home to help achieve goal of losing weight.  Progress workloads to help improve functional capacity.  -      Nutrition & Weight -  Outcomes: Pre Biometrics - 12/30/18 1455      Pre Biometrics   Height  5' 10.5" (1.791 m)    Weight  99.8 kg    Waist Circumference  45 inches    Hip Circumference  41.5 inches    Waist to Hip Ratio  1.08 %    BMI (Calculated)  31.11    Triceps Skinfold  30 mm    % Body Fat  33.9 %    Grip Strength  37 kg    Flexibility  9 in    Single Leg Stand  19.87 seconds        Nutrition: Nutrition Therapy & Goals - 12/30/18 1549      Nutrition Therapy   Diet  heart healthy      Personal Nutrition Goals   Nutrition Goal  Pt to identify and limit food sources of saturated fat, trans fat, refined carbohydrates and sodium    Personal Goal #2  Pt to identify food quantities necessary to achieve weight loss of 6-24 lbs. at graduation from cardiac rehab.       Intervention Plan   Intervention  Prescribe, educate and counsel regarding individualized specific dietary modifications aiming towards targeted core components such as weight, hypertension, lipid management, diabetes, heart failure and other comorbidities.    Expected Outcomes  Short Term Goal: Understand basic principles of dietary content, such as calories, fat, sodium, cholesterol and nutrients.;Long Term Goal: Adherence to prescribed nutrition plan.       Nutrition Discharge:   Education Questionnaire Score: Knowledge Questionnaire Score - 12/30/18 1404      Knowledge Questionnaire Score   Pre Score  20/24       Emmaus attended 23 exercise sessions between 12/30/18-02/21/19. Chaka's attendance was good. Mat did well with exercise. Outpatient cardiac rehab remains closed at this time due to the COVID 19 Pandemic. Patient was offered virtual cardiac rehab. Bryse declined and says that he is exercising on his own. Leoncio will be discharged from cardiac rehab as requested.Gladstone Lighter, RN,BSN 06/01/2019 1:50 PM

## 2019-06-27 ENCOUNTER — Telehealth: Payer: Self-pay

## 2019-06-27 NOTE — Telephone Encounter (Signed)
Virtual Visit Pre-Appointment Phone Call  TELEPHONE CALL NOTE  Matthew Henderson has been deemed a candidate for a follow-up tele-health visit to limit community exposure during the Covid-19 pandemic. I spoke with the patient via phone to ensure availability of phone/video source, confirm preferred email & phone number, and discuss instructions and expectations.  I reminded Matthew Henderson to be prepared with any vital sign and/or heart rhythm information that could potentially be obtained via home monitoring, at the time of his visit. I reminded Matthew Henderson to expect a phone call prior to his visit.  Patient agrees to consent below.  Cleon Gustin, RN 06/27/2019 4:01 PM    FULL LENGTH CONSENT FOR TELE-HEALTH VISIT   I hereby voluntarily request, consent and authorize [facility] and its employed or contracted physicians, physician assistants, nurse practitioners or other licensed health care professionals (the Practitioner), to provide me with telemedicine health care services (the "Services") as deemed necessary by the treating Practitioner. I acknowledge and consent to receive the Services by the Practitioner via telemedicine. I understand that the telemedicine visit will involve communicating with the Practitioner through live audiovisual communication technology and the disclosure of certain medical information by electronic transmission. I acknowledge that I have been given the opportunity to request an in-person assessment or other available alternative prior to the telemedicine visit and am voluntarily participating in the telemedicine visit.  I understand that I have the right to withhold or withdraw my consent to the use of telemedicine in the course of my care at any time, without affecting my right to future care or treatment, and that the Practitioner or I may terminate the telemedicine visit at any time. I understand that I have the right to inspect all information  obtained and/or recorded in the course of the telemedicine visit and may receive copies of available information for a reasonable fee.  I understand that some of the potential risks of receiving the Services via telemedicine include:  [name] Delay or interruption in medical evaluation due to technological equipment failure or disruption; . Information transmitted may not be sufficient (e.g. poor resolution of images) to allow for appropriate medical decision making by the Practitioner; and/or  . In rare instances, security protocols could fail, causing a breach of personal health information.  Furthermore, I acknowledge that it is my responsibility to provide information about my medical history, conditions and care that is complete and accurate to the best of my ability. I acknowledge that Practitioner's advice, recommendations, and/or decision may be based on factors not within their control, such as incomplete or inaccurate data provided by me or distortions of diagnostic images or specimens that may result from electronic transmissions. I understand that the practice of medicine is not an exact science and that Practitioner makes no warranties or guarantees regarding treatment outcomes. I acknowledge that I will receive a copy of this consent concurrently upon execution via email to the email address I last provided but may also request a printed copy by calling the office of [place].    I understand that my insurance will be billed for this visit.   I have read or had this consent read to me. . I understand the contents of this consent, which adequately explains the benefits and risks of the Services being provided via telemedicine.  . I have been provided ample opportunity to ask questions regarding this consent and the Services and have had my questions answered to my satisfaction. [name]  I give my informed consent for the services to be provided through the use of telemedicine in my medical care   By participating in this telemedicine visit I agree to the above.

## 2019-06-28 ENCOUNTER — Telehealth (INDEPENDENT_AMBULATORY_CARE_PROVIDER_SITE_OTHER): Payer: Medicare Other | Admitting: Interventional Cardiology

## 2019-06-28 ENCOUNTER — Encounter: Payer: Self-pay | Admitting: Interventional Cardiology

## 2019-06-28 ENCOUNTER — Other Ambulatory Visit: Payer: Self-pay

## 2019-06-28 VITALS — BP 144/73 | HR 67 | Ht 70.5 in | Wt 215.0 lb

## 2019-06-28 DIAGNOSIS — E785 Hyperlipidemia, unspecified: Secondary | ICD-10-CM

## 2019-06-28 DIAGNOSIS — Z9861 Coronary angioplasty status: Secondary | ICD-10-CM

## 2019-06-28 DIAGNOSIS — I2510 Atherosclerotic heart disease of native coronary artery without angina pectoris: Secondary | ICD-10-CM

## 2019-06-28 DIAGNOSIS — I119 Hypertensive heart disease without heart failure: Secondary | ICD-10-CM

## 2019-06-28 DIAGNOSIS — I10 Essential (primary) hypertension: Secondary | ICD-10-CM

## 2019-06-28 NOTE — Patient Instructions (Signed)
Medication Instructions:   Your physician recommends that you continue on your current medications as directed. Please refer to the Current Medication list given to you today.  If you need a refill on your cardiac medications before your next appointment, please call your pharmacy.   Lab work:  None ordered today  If you have labs (blood work) drawn today and your tests are completely normal, you will receive your results only by: . MyChart Message (if you have MyChart) OR . A paper copy in the mail If you have any lab test that is abnormal or we need to change your treatment, we will call you to review the results.  Testing/Procedures:  None ordered today  Follow-Up: At [facility], you and your health needs are our priority.  As part of our continuing mission to provide you with exceptional heart care, we have created designated Provider Care Teams.  These Care Teams include your primary Cardiologist (physician) and Advanced Practice Providers (APPs -  Physician Assistants and Nurse Practitioners) who all work together to provide you with the care you need, when you need it. You will need a follow up appointment in 6 months.  Please call our office 2 months in advance to schedule this appointment.  You may see Jayadeep Varanasi, MD or one of the following Advanced Practice Providers on your designated Care Team:   Brittainy Simmons, PA-C Dayna Dunn, PA-C . Michele Lenze, PA-C    

## 2019-06-28 NOTE — Progress Notes (Signed)
Virtual Visit via Video Note   This visit type was conducted due to national recommendations for restrictions regarding the COVID-19 Pandemic (e.g. social distancing) in an effort to limit this patient's exposure and mitigate transmission in our community.  Due to his co-morbid illnesses, this patient is at least at moderate risk for complications without adequate follow up.  This format is felt to be most appropriate for this patient at this time.  All issues noted in this document were discussed and addressed.  A limited physical exam was performed with this format.  Please refer to the patient's chart for his consent to telehealth for [facility].   Date:  06/28/2019   ID:  Matthew Henderson, DOB [birth year], MRN [id number]  Patient Location: Home Provider Location: Home  PCP:  Alroy Dust, Carlean Jews.Marlou Sa, MD  Cardiologist:  Larae Grooms, MD  Electrophysiologist:  None   Evaluation Performed:  Follow-Up Visit  Chief Complaint:  CAD  History of Present Illness:    Matthew Henderson is a 73 y.o. male With a h/o CAD, recently diagnosed in 11/19.  At that time, Pt endorsed on and off CP, occurring at rest and centrally located and pressure like sensation. He also noted change in exercise tolerance, with more fatigue with activities such as playing golf.   Nightof admission, he had developed more severe CP that would not resolve and no improvement with baby ASA.   In ED, EKG showed NSR, HR 83 bpm with no ischemic changes. POC troponin was negative. BP elevated at 179/97. BMP c/w renal insuffiencey w/ SCr at 1.36 (baseline ~1.0). IM admitted pt for further w/u.  Cath in 11/19 showed:  Prox RCA lesion is 80% stenosed.  A drug-eluting stent was successfully placed using a STENT SYNERGY DES 3X24.  Post intervention, there is a 0% residual stenosis.  Dist RCA lesion is 80% stenosed.  A drug-eluting stent was successfully placed using a STENT SYNERGY DES 2.5X20.  Post intervention,  there is a 0% residual stenosis.  Mid RCA lesion is 25% stenosed.  Mid LAD lesion is 25% stenosed.  Prox Cx lesion is 25% stenosed.  The left ventricular systolic function is normal.  The left ventricular ejection fraction is 50-55% by visual estimate.  LV end diastolic pressure is normal.  There is no aortic valve stenosis.    Recommend uninterrupted dual antiplatelet therapy with Aspirin 81mg  daily and Clopidogrel 75mg  dailyfor a minimum of 12 months (ACS - Class I recommendation).  He needs aggressive secondary prevention. Bradycardia resolved post procedure. BP stable after fluid bolus when leaving the cath lab.   Since leaving the hospital, he has done well.    Denies : Chest pain. Leg edema. Nitroglycerin use. Orthopnea. Palpitations. Paroxysmal nocturnal dyspnea. Shortness of breath. Syncope.   The patient does not have symptoms concerning for COVID-19 infection (fever, chills, cough, or new shortness of breath).   BP high this AM- 144/73, some associated dizziness.  It resolved later in the day.  Typically, BP has been in the 242-353I systolic.    Past Medical History:  Diagnosis Date  . CAD S/P percutaneous coronary angioplasty    s/p DES to prox RCA, DES to distal RCA, 11/01/18, residual mild nonobs dz in mid RCA, mid LAD and prox LCx  . Hypercholesteremia   . Hypertension   . Prostate enlargement    Past Surgical History:  Procedure Laterality Date  . CARDIAC CATHETERIZATION    . CORONARY STENT INTERVENTION N/A 11/01/2018   Procedure: CORONARY  STENT INTERVENTION;  Surgeon: Corky CraftsVaranasi,  S, MD;  [facility];  Service: Cardiovascular;  Laterality: N/A;  . HERNIA REPAIR     OVER 20 YEARS AGO  . LEFT HEART CATH AND CORONARY ANGIOGRAPHY N/A 11/01/2018   Procedure: LEFT HEART CATH AND CORONARY ANGIOGRAPHY;  Surgeon: Corky CraftsVaranasi,  S, MD;  [facility];  Service: Cardiovascular;  Laterality: N/A;     Current  Meds  Medication Sig  . amLODipine (NORVASC) 10 MG tablet Take 1 tablet (10 mg total) by mouth daily.  [name]. aspirin EC 81 MG EC tablet Take 1 tablet (81 mg total) by mouth daily.  [name]. atorvastatin (LIPITOR) 40 MG tablet Take 1 tablet (40 mg total) by mouth daily at 6 PM.  . carvedilol (COREG) 12.5 MG tablet Take 1 tablet (12.5 mg total) by mouth 2 (two) times daily.  . clopidogrel (PLAVIX) 75 MG tablet Take 1 tablet (75 mg total) by mouth daily with breakfast.  . ezetimibe (ZETIA) 10 MG tablet Take 1 tablet (10 mg total) by mouth daily at 6 PM.  . finasteride (PROSCAR) 5 MG tablet Take 5 mg by mouth daily.  . nitroGLYCERIN (NITROSTAT) 0.4 MG SL tablet Place 1 tablet (0.4 mg total) under the tongue every 5 (five) minutes as needed for chest pain.  . Omega-3 Fatty Acids (FISH OIL PO) Take 2 capsules by mouth daily.     Allergies:   Patient has no known allergies.   Social History   Tobacco Use  . Smoking status: Never Smoker  . Smokeless tobacco: Never Used  Substance Use Topics  . Alcohol use: No  . Drug use: No     Family Hx: The patient's family history includes Cancer in his mother; Heart attack in his father; Heart disease in his brother; Other in his maternal grandmother.  ROS:   Please see the history of present illness.    Dizziness this AM. All other systems reviewed and are negative.   Prior CV studies:   The following studies were reviewed today:  Cath results reviewed;   Labs/Other Tests and Data Reviewed:    EKG:  An ECG dated 11/19 was personally reviewed today and demonstrated:  NSR, no ST changes  Recent Labs: 11/02/2018: Hemoglobin 13.0; Platelets 156 11/17/2018: BUN 17; Creatinine, Ser 1.13; Potassium 4.2; Sodium 139   Recent Lipid Panel Lab Results  Component Value Date/Time   CHOL 119 11/02/2018 03:48 AM   TRIG 81 11/02/2018 03:48 AM   HDL 38 (L) 11/02/2018 03:48 AM   CHOLHDL 3.1 11/02/2018 03:48 AM   LDLCALC 65 11/02/2018 03:48 AM    Wt Readings  from Last 3 Encounters:  06/28/19 215 lb (97.5 kg)  12/30/18 220 lb 0.3 oz (99.8 kg)  11/17/18 219 lb 9.6 oz (99.6 kg)     Objective:    Vital Signs:  BP (!) 144/73   Pulse 67   Ht 5' 10.5" (1.791 m)   Wt 215 lb (97.5 kg)   BMI 30.41 kg/m    VITAL SIGNS:  reviewed GEN:  no acute distress RESPIRATORY:  normal respiratory effort, symmetric expansion PSYCH:  normal affect exam limited by video format  ASSESSMENT & PLAN:    1. CAD: No angina.  COntinue current meds.  Increase exercise.  WIll stop aspirin in 6 months 2. Hyperlipidemia: COntrolled in 11/19.  Needs recheck in 10/2019.   3. HTN: The current medical regimen is effective;  continue present plan and medications.  Increased caffeine  and less ssleep likely comtributed to this morning's readings.  Avoid water 3 hours prior to bedtime to help with sleep.   COVID-19 Education: The signs and symptoms of COVID-19 were discussed with the patient and how to seek care for testing (follow up with PCP or arrange E-visit).  The importance of social distancing was discussed today.  Time:   Today, I have spent  minutes with the patient with telehealth technology discussing the above problems.     Medication Adjustments/Labs and Tests Ordered: Current medicines are reviewed at length with the patient today.  Concerns regarding medicines are outlined above.   Tests Ordered: No orders of the defined types were placed in this encounter.   Medication Changes: No orders of the defined types were placed in this encounter.   Follow Up:  Virtual Visit or In Person in 6 month(s)  Signed, Lance MussJayadeep , MD  06/28/2019 3:51 PM    [facility]

## 2019-10-26 ENCOUNTER — Telehealth: Payer: Self-pay | Admitting: *Deleted

## 2019-10-26 NOTE — Telephone Encounter (Signed)
   [facility] Pre-operative Risk Assessment    Request for surgical clearance:  1. What type of surgery is being performed? COLONOSCOPY   2. When is this surgery scheduled? 11/17/19   3. What type of clearance is required (medical clearance vs. Pharmacy clearance to hold med vs. Both)? MEDICAL  4. Are there any medications that need to be held prior to surgery and how long? PLAVIX X 5 DAYS PRIOR   5. Practice name and name of physician performing surgery?  [facility]; DR. Alessandra Bevels   6. What is your office phone number [phone]    7.   What is your office fax number [phone]  8.   Anesthesia type (None, local, MAC, general) ? PROPOFOL   Julaine Hua 10/26/2019, 10:33 AM  _________________________________________________________________   (provider comments below)

## 2019-10-26 NOTE — Telephone Encounter (Signed)
OK to hold Plavix 5 days prior to colonoscopy.  

## 2019-10-27 NOTE — Telephone Encounter (Signed)
   Primary Cardiologist: Larae Grooms, MD  Chart reviewed as part of pre-operative protocol coverage. Patient was contacted 10/27/2019 in reference to pre-operative risk assessment for pending surgery as outlined below.  Matthew Henderson was last seen on 06/28/2019 via a telemedicine visit by Dr. Irish Lack.  Since that day, Matthew Henderson has done well from a cardiac standpoint. He walks 2 miles 3-4 times per week and plays golf without chest pain or SOB. Additionally he can go up a flight or two of stairs, perform household chores, and ADLs without chest pain or SOB. He can easily complete 4 METs without anginal complints.  Therefore, based on ACC/AHA guidelines, the patient would be at acceptable risk for the planned procedure without further cardiovascular testing.   Per Dr. Irish Lack, patient can hold plavix 5 days prior to his upcoming colonoscopy. He should restart plavix as soon as he is cleared to do so by his gastroenterologist.   I will route this recommendation to the requesting party via [facility] fax function and remove from pre-op pool.  Please call with questions.  Abigail Butts, PA-C 10/27/2019, 8:38 AM

## 2019-11-24 ENCOUNTER — Other Ambulatory Visit: Payer: Self-pay | Admitting: Interventional Cardiology

## 2020-01-27 ENCOUNTER — Other Ambulatory Visit: Payer: Self-pay | Admitting: Interventional Cardiology

## 2020-05-09 ENCOUNTER — Other Ambulatory Visit: Payer: Self-pay | Admitting: Interventional Cardiology

## 2020-05-12 ENCOUNTER — Other Ambulatory Visit: Payer: Self-pay | Admitting: Interventional Cardiology

## 2020-05-13 ENCOUNTER — Other Ambulatory Visit: Payer: Self-pay | Admitting: Interventional Cardiology

## 2020-05-19 ENCOUNTER — Emergency Department (HOSPITAL_COMMUNITY)
Admission: EM | Admit: 2020-05-19 | Discharge: 2020-05-19 | Disposition: A | Discharge status: Home / Self Care | Payer: Medicare PPO | Attending: Emergency Medicine | Admitting: Emergency Medicine

## 2020-05-19 ENCOUNTER — Encounter (HOSPITAL_COMMUNITY): Payer: Self-pay | Admitting: Emergency Medicine

## 2020-05-19 ENCOUNTER — Other Ambulatory Visit: Payer: Self-pay

## 2020-05-19 ENCOUNTER — Emergency Department (HOSPITAL_COMMUNITY): Payer: Medicare PPO

## 2020-05-19 DIAGNOSIS — Z7982 Long term (current) use of aspirin: Secondary | ICD-10-CM | POA: Diagnosis not present

## 2020-05-19 DIAGNOSIS — R42 Dizziness and giddiness: Secondary | ICD-10-CM | POA: Diagnosis not present

## 2020-05-19 DIAGNOSIS — Z9861 Coronary angioplasty status: Secondary | ICD-10-CM | POA: Diagnosis not present

## 2020-05-19 DIAGNOSIS — I25110 Atherosclerotic heart disease of native coronary artery with unstable angina pectoris: Secondary | ICD-10-CM | POA: Diagnosis not present

## 2020-05-19 DIAGNOSIS — I119 Hypertensive heart disease without heart failure: Secondary | ICD-10-CM | POA: Diagnosis not present

## 2020-05-19 DIAGNOSIS — I10 Essential (primary) hypertension: Secondary | ICD-10-CM

## 2020-05-19 DIAGNOSIS — Z79899 Other long term (current) drug therapy: Secondary | ICD-10-CM | POA: Insufficient documentation

## 2020-05-19 DIAGNOSIS — G459 Transient cerebral ischemic attack, unspecified: Secondary | ICD-10-CM | POA: Diagnosis not present

## 2020-05-19 LAB — URINALYSIS, ROUTINE W REFLEX MICROSCOPIC
Bilirubin Urine: NEGATIVE
Glucose, UA: NEGATIVE mg/dL
Ketones, ur: NEGATIVE mg/dL
Nitrite: NEGATIVE
Protein, ur: >=300 mg/dL — AB
Specific Gravity, Urine: 1.014 (ref 1.005–1.030)
pH: 5 (ref 5.0–8.0)

## 2020-05-19 LAB — CBC
HCT: 41 % (ref 39.0–52.0)
Hemoglobin: 13.2 g/dL (ref 13.0–17.0)
MCH: 28.8 pg (ref 26.0–34.0)
MCHC: 32.2 g/dL (ref 30.0–36.0)
MCV: 89.3 fL (ref 80.0–100.0)
Platelets: 169 10*3/uL (ref 150–400)
RBC: 4.59 MIL/uL (ref 4.22–5.81)
RDW: 14.3 % (ref 11.5–15.5)
WBC: 9.3 10*3/uL (ref 4.0–10.5)
nRBC: 0 % (ref 0.0–0.2)

## 2020-05-19 LAB — BASIC METABOLIC PANEL
Anion gap: 7 (ref 5–15)
BUN: 18 mg/dL (ref 8–23)
CO2: 25 mmol/L (ref 22–32)
Calcium: 8.7 mg/dL — ABNORMAL LOW (ref 8.9–10.3)
Chloride: 108 mmol/L (ref 98–111)
Creatinine, Ser: 1.36 mg/dL — ABNORMAL HIGH (ref 0.61–1.24)
GFR calc Af Amer: >60 mL/min (ref >60)
GFR calc non Af Amer: 52 mL/min — ABNORMAL LOW (ref >60)
Glucose, Bld: 96 mg/dL (ref 70–99)
Potassium: 4.5 mmol/L (ref 3.5–5.1)
Sodium: 140 mmol/L (ref 135–145)

## 2020-05-19 LAB — CBG MONITORING, ED: Glucose-Capillary: 79 mg/dL (ref 70–99)

## 2020-05-19 LAB — BRAIN NATRIURETIC PEPTIDE: B Natriuretic Peptide: 59 pg/mL (ref 0.0–100.0)

## 2020-05-19 LAB — HEPATIC FUNCTION PANEL
ALT: 17 U/L (ref 0–44)
AST: 20 U/L (ref 15–41)
Albumin: 3.8 g/dL (ref 3.5–5.0)
Alkaline Phosphatase: 63 U/L (ref 38–126)
Bilirubin, Direct: 0.1 mg/dL (ref 0.0–0.2)
Indirect Bilirubin: 1.4 mg/dL — ABNORMAL HIGH (ref 0.3–0.9)
Total Bilirubin: 1.5 mg/dL — ABNORMAL HIGH (ref 0.3–1.2)
Total Protein: 6.9 g/dL (ref 6.5–8.1)

## 2020-05-19 LAB — LIPASE, BLOOD: Lipase: 25 U/L (ref 11–51)

## 2020-05-19 LAB — TROPONIN I (HIGH SENSITIVITY): Troponin I (High Sensitivity): 7 ng/L (ref <18)

## 2020-05-19 MED ORDER — SODIUM CHLORIDE 0.9% FLUSH: 3.0000 mL | Freq: Once | INTRAVENOUS | Status: DC

## 2020-05-19 MED ORDER — ZOLPIDEM TARTRATE 5 MG PO TABS
5.0000 mg | ORAL_TABLET | Freq: Every evening | ORAL | 0 refills | Status: DC | PRN
Start: 2020-05-19 — End: 2022-09-26

## 2020-05-19 NOTE — ED Triage Notes (Signed)
Patient presents with dizziness that began Tuesday. Patient states that he was playing golf when he became light headed, and noticed he was fatigued more than usual when he got home. Patient states BP has been labile at home. Patient keeps detailed records. Patient states he has also had some epigastric discomfort. Patient does have a stent.

## 2020-05-19 NOTE — Discharge Instructions (Signed)
Your CT scan today as well as lab work are unremarkable.  Please cut down on salt intake.  Continue your current blood pressure medicines.  See your doctor for follow-up in the week.  Return to ER if you have worse dizziness, chest pain, shortness of breath.

## 2020-05-19 NOTE — ED Provider Notes (Signed)
[name] DEPT Provider Note   CSN: [id number] Arrival date & time: 05/19/20  1921     History Chief Complaint  Patient presents with  . Dizziness  . BP issues    Matthew Henderson is a 74 y.o. male hx of CAD s/p stent, HL, HTN, here presenting with dizziness.  Patient states that he was playing golf on Tuesday and became lightheaded and dizzy.  Patient has been eating out more this week or so.  Patient states that his blood pressure has been slightly more elevated this week. He is on Norvasc and carvedilol and his blood pressure has been running in the 150s to 160s.  Patient also has some occasional epigastric pain but denies any nausea or vomiting or fevers.  Patient states that the dizziness is worse when he stands up but denies any focal weakness or trouble speaking.  Denies any chest pain or shortness of breath.  The history is provided by the patient.       Past Medical History:  Diagnosis Date  . CAD S/P percutaneous coronary angioplasty    s/p DES to prox RCA, DES to distal RCA, 11/01/18, residual mild nonobs dz in mid RCA, mid LAD and prox LCx  . Hypercholesteremia   . Hypertension   . Prostate enlargement     Patient Active Problem List   Diagnosis Date Noted  . CAD S/P percutaneous coronary angioplasty- DES to prox RCA, DES to distal RCA 11/01/18 11/02/2018  . Unstable angina ([place])   . Coronary artery calcification seen on CT scan   . Hypertension 10/31/2018  . Lightheadedness 10/31/2018  . BPH (benign prostatic hyperplasia) 10/31/2018  . Hyperlipidemia 10/31/2018  . Tachycardia 10/31/2018  . Abnormal findings on diagnostic imaging of spine 10/31/2018  . Chest pain 10/30/2018    Past Surgical History:  Procedure Laterality Date  . CARDIAC CATHETERIZATION    . CORONARY STENT INTERVENTION N/A 11/01/2018   Procedure: CORONARY STENT INTERVENTION;  Surgeon: Jettie Booze, MD;  Location: [place] [facility] CV LAB;  Service:  Cardiovascular;  Laterality: N/A;  . HERNIA REPAIR     OVER 20 YEARS AGO  . LEFT HEART CATH AND CORONARY ANGIOGRAPHY N/A 11/01/2018   Procedure: LEFT HEART CATH AND CORONARY ANGIOGRAPHY;  Surgeon: Jettie Booze, MD;  Location: [facility] CV LAB;  Service: Cardiovascular;  Laterality: N/A;       Family History  Problem Relation Age of Onset  . Cancer Mother        BREAST  . Heart attack Father   . Heart disease Brother        STENTS  . Other Maternal Grandmother        OLD AGE    Social History   Tobacco Use  . Smoking status: Never Smoker  . Smokeless tobacco: Never Used  Substance Use Topics  . Alcohol use: No  . Drug use: No    Home Medications Prior to Admission medications   Medication Sig Start Date End Date Taking? Authorizing Provider  amLODipine (NORVASC) 10 MG tablet Take 1 tablet (10 mg total) by mouth daily. Please make yearly appt with Dr. Irish Lack for July before anymore refills. 1st attempt 05/15/20  Yes Jettie Booze, MD  aspirin EC 81 MG EC tablet Take 1 tablet (81 mg total) by mouth daily. 11/03/18  Yes Lyda Jester M, PA-C  atorvastatin (LIPITOR) 40 MG tablet Take 1 tablet (40 mg total) by mouth daily at 6 PM. Must schedule follow  up visit for further refills [phone] 05/15/20  Yes Corky Crafts, MD  carvedilol (COREG) 12.5 MG tablet TAKE 1 TABLET (12.5 MG TOTAL) BY MOUTH 2 (TWO) TIMES DAILY. 05/09/20  Yes Corky Crafts, MD  clopidogrel (PLAVIX) 75 MG tablet Take 1 tablet (75 mg total) by mouth daily with breakfast. Please make yearly appt with Dr. Eldridge Dace for July before anymore refills. 1st attempt 05/15/20  Yes Corky Crafts, MD  ezetimibe (ZETIA) 10 MG tablet Take 1 tablet (10 mg total) by mouth daily at 6 PM. 11/17/18  Yes Corky Crafts, MD  finasteride (PROSCAR) 5 MG tablet Take 5 mg by mouth daily. 07/09/18  Yes [provider]  nitroGLYCERIN (NITROSTAT) 0.4 MG SL tablet Place 1 tablet (0.4 mg  total) under the tongue every 5 (five) minutes as needed for chest pain. 11/17/18  Yes Corky Crafts, MD  Omega-3 Fatty Acids (FISH OIL PO) Take 2 capsules by mouth daily.   Yes [provider]    Allergies    Patient has no known allergies.  Review of Systems   Review of Systems  Neurological: Positive for dizziness.  All other systems reviewed and are negative.   Physical Exam Updated Vital Signs BP (!) 153/90   Pulse 74   Temp 98.9 F (37.2 C) (Oral)   Resp 16   Ht 6' (1.829 m)   Wt 99.3 kg   SpO2 100%   BMI 29.70 kg/m   Physical Exam Vitals and nursing note reviewed.  Constitutional:      Appearance: Normal appearance.  HENT:     Head: Normocephalic.     Nose: Nose normal.     Mouth/Throat:     Mouth: Mucous membranes are moist.  Eyes:     Extraocular Movements: Extraocular movements intact.     Pupils: Pupils are equal, round, and reactive to light.  Cardiovascular:     Rate and Rhythm: Normal rate and regular rhythm.     Pulses: Normal pulses.     Heart sounds: Normal heart sounds.  Pulmonary:     Effort: Pulmonary effort is normal.     Breath sounds: Normal breath sounds.  Abdominal:     General: Abdomen is flat.     Palpations: Abdomen is soft.  Musculoskeletal:        General: Normal range of motion.     Cervical back: Normal range of motion.  Skin:    General: Skin is warm.     Capillary Refill: Capillary refill takes less than 2 seconds.  Neurological:     General: No focal deficit present.     Mental Status: He is alert and oriented to person, place, and time.     Cranial Nerves: No cranial nerve deficit.     Sensory: No sensory deficit.     Motor: No weakness.     Coordination: Coordination normal.     Comments: CN 2- 12 intact, nl strength throughout, nl gait, nl sensation   Psychiatric:        Mood and Affect: Mood normal.     ED Results / Procedures / Treatments   Labs (all labs ordered are listed, but only abnormal  results are displayed) Labs Reviewed  BASIC METABOLIC PANEL - Abnormal; Notable for the following components:      Result Value   Creatinine, Ser 1.36 (*)    Calcium 8.7 (*)    GFR calc non Af Amer 52 (*)    All other components  within normal limits  URINALYSIS, ROUTINE W REFLEX MICROSCOPIC - Abnormal; Notable for the following components:   APPearance HAZY (*)    Hgb urine dipstick MODERATE (*)    Protein, ur >=300 (*)    Leukocytes,Ua TRACE (*)    Bacteria, UA RARE (*)    All other components within normal limits  HEPATIC FUNCTION PANEL - Abnormal; Notable for the following components:   Total Bilirubin 1.5 (*)    Indirect Bilirubin 1.4 (*)    All other components within normal limits  CBC  BRAIN NATRIURETIC PEPTIDE  LIPASE, BLOOD  CBG MONITORING, ED  TROPONIN I (HIGH SENSITIVITY)  TROPONIN I (HIGH SENSITIVITY)    EKG EKG Interpretation  Date/Time:  Saturday May 19 2020 19:52:00 EDT Ventricular Rate:  71 PR Interval:    QRS Duration: 95 QT Interval:  398 QTC Calculation: 433 R Axis:   -11 Text Interpretation: Sinus rhythm Abnormal R-wave progression, early transition 12 Lead; [place] No significant change since last tracing Confirmed by Richardean Canal [phone]) on 05/19/2020 8:17:24 PM   Radiology CT Head Wo Contrast  Result Date: 05/19/2020 CLINICAL DATA:  Transient ischemic attack (TIA) dizziness EXAM: CT HEAD WITHOUT CONTRAST TECHNIQUE: Contiguous axial images were obtained from the base of the skull through the vertex without intravenous contrast. COMPARISON:  Head CT and brain MRI 06/07/2013 FINDINGS: Brain: No intracranial hemorrhage, mass effect, or midline shift. Mild chronic small vessel ischemia. No hydrocephalus. The basilar cisterns are patent. No evidence of territorial infarct or acute ischemia. No extra-axial or intracranial fluid collection. Vascular: No hyperdense vessel or unexpected calcification. Skull: Normal. Negative for fracture or focal lesion.  Sinuses/Orbits: Paranasal sinuses and mastoid air cells are clear. The visualized orbits are unremarkable. Other: None. IMPRESSION: 1. No acute intracranial abnormality. 2. Mild chronic small vessel ischemia. Electronically Signed   By: Narda Rutherford M.D.   On: 05/19/2020 21:17    Procedures Procedures (including critical care time)  Medications Ordered in ED Medications - No data to display  ED Course  I have reviewed the triage vital signs and the nursing notes.  Pertinent labs & imaging results that were available during my care of the patient were reviewed by me and considered in my medical decision making (see chart for details).    MDM Rules/Calculators/A&P                      Matthew Henderson is a 74 y.o. male here with dizziness.  Patient has been eating more salt recently and his blood pressure has been more elevated.  However he also has some lightheadedness when he stands up.  Wonder if he has symptomatic hypertension.  Given new dizziness, will get CT head to rule out a head bleed.  However I do not think he has a posterior circulation stroke.  He does have a history of CAD but has no chest pain and symptoms are going on for several days so if troponin negative will not need a second troponin.  Will get CBC, CMP, troponin, CT head, orthostatics.  10:15 PM Patient is not orthostatic.  His urinalysis shows some proteinuria.  But his creatinine is normal.  CT head is unremarkable.  I think he stable for discharge and told him to decrease his salt intake.  I told him that if we increase his blood pressure medicine, he will get more dizzy.   Final Clinical Impression(s) / ED Diagnoses Final diagnoses:  None    Rx /  DC Orders ED Discharge Orders    None       Charlynne Pander, MD 05/19/20 2216

## 2020-05-21 DIAGNOSIS — R8290 Unspecified abnormal findings in urine: Secondary | ICD-10-CM | POA: Diagnosis not present

## 2020-05-21 DIAGNOSIS — R42 Dizziness and giddiness: Secondary | ICD-10-CM | POA: Diagnosis not present

## 2020-05-21 DIAGNOSIS — I10 Essential (primary) hypertension: Secondary | ICD-10-CM | POA: Diagnosis not present

## 2020-05-21 DIAGNOSIS — N400 Enlarged prostate without lower urinary tract symptoms: Secondary | ICD-10-CM | POA: Diagnosis not present

## 2020-06-09 ENCOUNTER — Other Ambulatory Visit: Payer: Self-pay | Admitting: Interventional Cardiology

## 2020-06-22 DIAGNOSIS — R350 Frequency of micturition: Secondary | ICD-10-CM | POA: Diagnosis not present

## 2020-06-22 DIAGNOSIS — N401 Enlarged prostate with lower urinary tract symptoms: Secondary | ICD-10-CM | POA: Diagnosis not present

## 2020-06-22 DIAGNOSIS — R3121 Asymptomatic microscopic hematuria: Secondary | ICD-10-CM | POA: Diagnosis not present

## 2020-06-22 DIAGNOSIS — R351 Nocturia: Secondary | ICD-10-CM | POA: Diagnosis not present

## 2020-06-29 DIAGNOSIS — R3121 Asymptomatic microscopic hematuria: Secondary | ICD-10-CM | POA: Diagnosis not present

## 2020-07-05 DIAGNOSIS — K5730 Diverticulosis of large intestine without perforation or abscess without bleeding: Secondary | ICD-10-CM | POA: Diagnosis not present

## 2020-07-05 DIAGNOSIS — K7689 Other specified diseases of liver: Secondary | ICD-10-CM | POA: Diagnosis not present

## 2020-07-05 DIAGNOSIS — R3121 Asymptomatic microscopic hematuria: Secondary | ICD-10-CM | POA: Diagnosis not present

## 2020-07-05 DIAGNOSIS — N281 Cyst of kidney, acquired: Secondary | ICD-10-CM | POA: Diagnosis not present

## 2020-07-05 DIAGNOSIS — N400 Enlarged prostate without lower urinary tract symptoms: Secondary | ICD-10-CM | POA: Diagnosis not present

## 2020-07-06 DIAGNOSIS — R350 Frequency of micturition: Secondary | ICD-10-CM | POA: Diagnosis not present

## 2020-07-06 DIAGNOSIS — N401 Enlarged prostate with lower urinary tract symptoms: Secondary | ICD-10-CM | POA: Diagnosis not present

## 2020-07-06 DIAGNOSIS — R351 Nocturia: Secondary | ICD-10-CM | POA: Diagnosis not present

## 2020-07-06 DIAGNOSIS — R3121 Asymptomatic microscopic hematuria: Secondary | ICD-10-CM | POA: Diagnosis not present

## 2020-07-12 ENCOUNTER — Other Ambulatory Visit: Payer: Self-pay | Admitting: Interventional Cardiology

## 2020-07-26 NOTE — Progress Notes (Signed)
Cardiology Office Note   Date:  07/27/2020   ID:  Matthew, Henderson 1946/04/13, MRN [id number]  PCP:  Asencion Gowda.August Saucer, MD    No chief complaint on file.  CAD  Wt Readings from Last 3 Encounters:  07/27/20 224 lb (101.6 kg)  05/19/20 219 lb (99.3 kg)  06/28/19 215 lb (97.5 kg)       History of Present Illness: Matthew Henderson is a 74 y.o. male  With a h/o CAD, recently diagnosed in 11/19.  At that time,Pt endorsed on and off CP, occurring at rest and centrally located and pressure like sensation. He also noted change in exercise tolerance, with more fatigue with activities such as playing golf.   Nightof admission, he had developed more severe CP that would not resolve and no improvement with baby ASA.   In ED, EKG showed NSR, HR 83 bpm with no ischemic changes. POC troponin was negative. BP elevated at 179/97. BMP c/w renal insuffiencey w/ SCr at 1.36 (baseline ~1.0). IM admitted pt for further w/u.  Cath in 11/19 showed:  Prox RCA lesion is 80% stenosed.  A drug-eluting stent was successfully placed using a STENT SYNERGY DES 3X24.  Post intervention, there is a 0% residual stenosis.  Dist RCA lesion is 80% stenosed.  A drug-eluting stent was successfully placed using a STENT SYNERGY DES 2.5X20.  Post intervention, there is a 0% residual stenosis.  Mid RCA lesion is 25% stenosed.  Mid LAD lesion is 25% stenosed.  Prox Cx lesion is 25% stenosed.  The left ventricular systolic function is normal.  The left ventricular ejection fraction is 50-55% by visual estimate.  LV end diastolic pressure is normal.  There is no aortic valve stenosis.    Recommend uninterrupted dual antiplatelet therapy with Aspirin 81mg  daily and Clopidogrel 75mg  dailyfor a minimum of 12 months (ACS - Class I recommendation).  to ER in June 2021 : "Patient states that he was playing golf on Tuesday and became lightheaded and dizzy.  Patient has been eating out  more this week or so.  Patient states that his blood pressure has been slightly more elevated this week. He is on Norvasc and carvedilol and his blood pressure has been running in the 150s to 160s.  Patient also has some occasional epigastric pain but denies any nausea or vomiting or fevers.  Patient states that the dizziness is worse when he stands up but denies any focal weakness or trouble speaking.  Denies any chest pain or shortness of breath."  Plan from ER : "Patient is not orthostatic.  His urinalysis shows some proteinuria.  But his creatinine is normal.  CT head is unremarkable.  I think he stable for discharge and told him to decrease his salt intake.  I told him that if we increase his blood pressure medicine, he will get more dizzy."  Since then, dizziness has been better. BP has been controlled at home.  Readings are around 130-140 in the morning.    Denies : Chest pain. Dizziness.  Nitroglycerin use. Orthopnea. Palpitations. Paroxysmal nocturnal dyspnea. Shortness of breath. Syncope.   No sx like his prior angina.   Past Medical History:  Diagnosis Date  . CAD S/P percutaneous coronary angioplasty    s/p DES to prox RCA, DES to distal RCA, 11/01/18, residual mild nonobs dz in mid RCA, mid LAD and prox LCx  . Hypercholesteremia   . Hypertension   . Prostate enlargement     Past  Surgical History:  Procedure Laterality Date  . CARDIAC CATHETERIZATION    . CORONARY STENT INTERVENTION N/A 11/01/2018   Procedure: CORONARY STENT INTERVENTION;  Surgeon: Corky Crafts, MD;  Location: [facility] INVASIVE CV LAB;  Service: Cardiovascular;  Laterality: N/A;  . HERNIA REPAIR     OVER 20 YEARS AGO  . LEFT HEART CATH AND CORONARY ANGIOGRAPHY N/A 11/01/2018   Procedure: LEFT HEART CATH AND CORONARY ANGIOGRAPHY;  Surgeon: Corky Crafts, MD;  Location: [facility] INVASIVE CV LAB;  Service: Cardiovascular;  Laterality: N/A;     Current Outpatient Medications  Medication Sig Dispense Refill   . amLODipine (NORVASC) 10 MG tablet Take 1 tablet (10 mg total) by mouth daily. Please make yearly appt with Dr. Eldridge Dace for July before anymore refills. 1st attempt 90 tablet 0  . aspirin EC 81 MG EC tablet Take 1 tablet (81 mg total) by mouth daily. 30 tablet 10  . atorvastatin (LIPITOR) 40 MG tablet Take 1 tablet (40 mg total) by mouth daily. Please keep upcoming appt with Dr. Eldridge Dace in August before anymore refills. Thank you 30 tablet 0  . carvedilol (COREG) 12.5 MG tablet TAKE 1 TABLET (12.5 MG TOTAL) BY MOUTH 2 (TWO) TIMES DAILY. 180 tablet 0  . clopidogrel (PLAVIX) 75 MG tablet Take 1 tablet (75 mg total) by mouth daily with breakfast. Please make yearly appt with Dr. Eldridge Dace for July before anymore refills. 1st attempt 90 tablet 0  . ezetimibe (ZETIA) 10 MG tablet Take 1 tablet (10 mg total) by mouth daily at 6 PM. 90 tablet 3  . finasteride (PROSCAR) 5 MG tablet Take 5 mg by mouth daily.  3  . nitroGLYCERIN (NITROSTAT) 0.4 MG SL tablet Place 1 tablet (0.4 mg total) under the tongue every 5 (five) minutes as needed for chest pain. 25 tablet 3  . Omega-3 Fatty Acids (FISH OIL PO) Take 2 capsules by mouth daily.    . tamsulosin (FLOMAX) 0.4 MG CAPS capsule Take 0.4 mg by mouth daily.    [name] zolpidem (AMBIEN) 5 MG tablet Take 1 tablet (5 mg total) by mouth at bedtime as needed for sleep. 5 tablet 0   No current facility-administered medications for this visit.    Allergies:   Patient has no known allergies.    Social History:  The patient  reports that he has never smoked. He has never used smokeless tobacco. He reports that he does not drink alcohol and does not use drugs.   Family History:  The patient's family history includes Cancer in his mother; Heart attack in his father; Heart disease in his brother; Other in his maternal grandmother.    ROS:  Please see the history of present illness.   Otherwise, review of systems are positive for dizziness resolved- microscopic hematuria.    All other systems are reviewed and negative.    PHYSICAL EXAM: VS:  BP (!) 150/80   Pulse 62   Ht 6' (1.829 m)   Wt 224 lb (101.6 kg)   SpO2 98%   BMI 30.38 kg/m  , BMI Body mass index is 30.38 kg/m. GEN: Well nourished, well developed, in no acute distress  HEENT: normal  Neck: no JVD, carotid bruits, or masses Cardiac: RRR; no murmurs, rubs, or gallops,minimal pitting ankle edema  Respiratory:  clear to auscultation bilaterally, normal work of breathing GI: soft, nontender, nondistended, + BS MS: no deformity or atrophy  Skin: warm and dry, no rash Neuro:  Strength and sensation are intact Psych:  euthymic mood, full affect   EKG:   The ekg ordered June 2021 demonstrates NSR, no ST changes   Recent Labs: 05/19/2020: ALT 17; B Natriuretic Peptide 59.0; BUN 18; Creatinine, Ser 1.36; Hemoglobin 13.2; Platelets 169; Potassium 4.5; Sodium 140   Lipid Panel    Component Value Date/Time   CHOL 119 11/02/2018 0348   TRIG 81 11/02/2018 0348   HDL 38 (L) 11/02/2018 0348   CHOLHDL 3.1 11/02/2018 0348   VLDL 16 11/02/2018 0348   LDLCALC 65 11/02/2018 0348     Other studies Reviewed: Additional studies/ records that were reviewed today with results demonstrating: Labs reviewed; ER records reviewed.   ASSESSMENT AND PLAN:  1.   CAD: s/p PCI. Stop aspirin. Continue clopidogrel monotherapy.  Continue aggressive secondary prevention. 2.   HTN: Repeat BP 140/80.  Increase activity.  Low salt. 3.   Lightheadedness: resolved.  Stay hydrated. 4.   High cholesterol: LDL 75.  Continue statin.  Whole food plant based diet.   Hematuria: microscopic- cysto done.  Enlarged prostate. LE edema: Minor- elevate legs.  Decrease salt.    Current medicines are reviewed at length with the patient today.  The patient concerns regarding his medicines were addressed.  The following changes have been made:  No change  Labs/ tests ordered today include:  No orders of the defined types were  placed in this encounter.   Recommend 150 minutes/week of aerobic exercise Low fat, low carb, high fiber diet recommended  Disposition:   FU in 1 year   Signed, Lance Muss, MD  07/27/2020 9:21 AM    [facility] [address] [place], [place], [place]  [address] Phone: [phone]; Fax: [phone]

## 2020-07-27 ENCOUNTER — Other Ambulatory Visit: Payer: Self-pay

## 2020-07-27 ENCOUNTER — Ambulatory Visit (INDEPENDENT_AMBULATORY_CARE_PROVIDER_SITE_OTHER): Payer: Medicare PPO | Admitting: Interventional Cardiology

## 2020-07-27 ENCOUNTER — Encounter: Payer: Self-pay | Admitting: Interventional Cardiology

## 2020-07-27 VITALS — BP 150/80 | HR 62 | Ht 72.0 in | Wt 224.0 lb

## 2020-07-27 DIAGNOSIS — R42 Dizziness and giddiness: Secondary | ICD-10-CM | POA: Diagnosis not present

## 2020-07-27 DIAGNOSIS — E785 Hyperlipidemia, unspecified: Secondary | ICD-10-CM

## 2020-07-27 DIAGNOSIS — I10 Essential (primary) hypertension: Secondary | ICD-10-CM

## 2020-07-27 DIAGNOSIS — I2510 Atherosclerotic heart disease of native coronary artery without angina pectoris: Secondary | ICD-10-CM

## 2020-07-27 DIAGNOSIS — Z9861 Coronary angioplasty status: Secondary | ICD-10-CM

## 2020-07-27 MED ORDER — CLOPIDOGREL BISULFATE 75 MG PO TABS: 75.0000 mg | ORAL_TABLET | Freq: Every day | ORAL | 3 refills | Status: DC

## 2020-07-27 MED ORDER — AMLODIPINE BESYLATE 10 MG PO TABS: 10.0000 mg | ORAL_TABLET | Freq: Every day | ORAL | 3 refills | Status: DC

## 2020-07-27 MED ORDER — NITROGLYCERIN 0.4 MG SL SUBL: 0.4000 mg | SUBLINGUAL_TABLET | SUBLINGUAL | 3 refills | Status: DC | PRN

## 2020-07-27 MED ORDER — CARVEDILOL 12.5 MG PO TABS: 12.5000 mg | ORAL_TABLET | Freq: Two times a day (BID) | ORAL | 3 refills | Status: DC

## 2020-07-27 MED ORDER — EZETIMIBE 10 MG PO TABS: 10.0000 mg | ORAL_TABLET | Freq: Every day | ORAL | 3 refills | Status: DC

## 2020-07-27 MED ORDER — ATORVASTATIN CALCIUM 40 MG PO TABS: 40.0000 mg | ORAL_TABLET | Freq: Every day | ORAL | 3 refills | Status: DC

## 2020-07-27 NOTE — Patient Instructions (Signed)
Medication Instructions:  Your physician has recommended you make the following change in your medication:   1. STOP: Aspirin  2. Continue taking clopidogrel (plavix) and all of your other current medications  *If you need a refill on your cardiac medications before your next appointment, please call your pharmacy*   Lab Work: None  If you have labs (blood work) drawn today and your tests are completely normal, you will receive your results only by: [name] MyChart Message (if you have MyChart) OR . A paper copy in the mail If you have any lab test that is abnormal or we need to change your treatment, we will call you to review the results.   Testing/Procedures: None   Follow-Up: At [facility], you and your health needs are our priority.  As part of our continuing mission to provide you with exceptional heart care, we have created designated Provider Care Teams.  These Care Teams include your primary Cardiologist (physician) and Advanced Practice Providers (APPs -  Physician Assistants and Nurse Practitioners) who all work together to provide you with the care you need, when you need it.  We recommend signing up for the patient portal called "MyChart".  Sign up information is provided on this After Visit Summary.  MyChart is used to connect with patients for Virtual Visits (Telemedicine).  Patients are able to view lab/test results, encounter notes, upcoming appointments, etc.  Non-urgent messages can be sent to your provider as well.   To learn more about what you can do with MyChart, go to [email or web address].    Your next appointment:   12 month(s)  The format for your next appointment:   In Person  Provider:   You may see Lance Muss, MD or one of the following Advanced Practice Providers on your designated Care Team:    Ronie Spies, PA-C  Jacolyn Reedy, PA-C    Other Instructions Your provider recommends that you maintain 150 minutes per week of moderate  aerobic activity.   High-Fiber Diet Fiber, also called dietary fiber, is a type of carbohydrate that is found in fruits, vegetables, whole grains, and beans. A high-fiber diet can have many health benefits. Your health care provider may recommend a high-fiber diet to help:  Prevent constipation. Fiber can make your bowel movements more regular.  Lower your cholesterol.  Relieve the following conditions: ? Swelling of veins in the anus (hemorrhoids). ? Swelling and irritation (inflammation) of specific areas of the digestive tract (uncomplicated diverticulosis). ? A problem of the large intestine (colon) that sometimes causes pain and diarrhea (irritable bowel syndrome, IBS).  Prevent overeating as part of a weight-loss plan.  Prevent heart disease, type 2 diabetes, and certain cancers. What is my plan? The recommended daily fiber intake in grams (g) includes:  38 g for men age 58 or younger.  30 g for men over age 40.  25 g for women age 3 or younger.  21 g for women over age 32. You can get the recommended daily intake of dietary fiber by:  Eating a variety of fruits, vegetables, grains, and beans.  Taking a fiber supplement, if it is not possible to get enough fiber through your diet. What do I need to know about a high-fiber diet?  It is better to get fiber through food sources rather than from fiber supplements. There is not a lot of research about how effective supplements are.  Always check the fiber content on the nutrition facts label of any prepackaged food. Look  for foods that contain 5 g of fiber or more per serving.  Talk with a diet and nutrition specialist (dietitian) if you have questions about specific foods that are recommended or not recommended for your medical condition, especially if those foods are not listed below.  Gradually increase how much fiber you consume. If you increase your intake of dietary fiber too quickly, you may have bloating, cramping,  or gas.  Drink plenty of water. Water helps you to digest fiber. What are tips for following this plan?  Eat a wide variety of high-fiber foods.  Make sure that half of the grains that you eat each day are whole grains.  Eat breads and cereals that are made with whole-grain flour instead of refined flour or white flour.  Eat brown rice, bulgur wheat, or millet instead of white rice.  Start the day with a breakfast that is high in fiber, such as a cereal that contains 5 g of fiber or more per serving.  Use beans in place of meat in soups, salads, and pasta dishes.  Eat high-fiber snacks, such as berries, raw vegetables, nuts, and popcorn.  Choose whole fruits and vegetables instead of processed forms like juice or sauce. What foods can I eat?  Fruits Berries. Pears. Apples. Oranges. Avocado. Prunes and raisins. Dried figs. Vegetables Sweet potatoes. Spinach. Kale. Artichokes. Cabbage. Broccoli. Cauliflower. Green peas. Carrots. Squash. Grains Whole-grain breads. Multigrain cereal. Oats and oatmeal. Brown rice. Barley. Bulgur wheat. Millet. Quinoa. Bran muffins. Popcorn. Rye wafer crackers. Meats and other proteins Navy, kidney, and pinto beans. Soybeans. Split peas. Lentils. Nuts and seeds. Dairy Fiber-fortified yogurt. Beverages Fiber-fortified soy milk. Fiber-fortified orange juice. Other foods Fiber bars. The items listed above may not be a complete list of recommended foods and beverages. Contact a dietitian for more options. What foods are not recommended? Fruits Fruit juice. Cooked, strained fruit. Vegetables Fried potatoes. Canned vegetables. Well-cooked vegetables. Grains White bread. Pasta made with refined flour. White rice. Meats and other proteins Fatty cuts of meat. Fried chicken or fried fish. Dairy Milk. Yogurt. Cream cheese. Sour cream. Fats and oils Butters. Beverages Soft drinks. Other foods Cakes and pastries. The items listed above may not be a  complete list of foods and beverages to avoid. Contact a dietitian for more information. Summary  Fiber is a type of carbohydrate. It is found in fruits, vegetables, whole grains, and beans.  There are many health benefits of eating a high-fiber diet, such as preventing constipation, lowering blood cholesterol, helping with weight loss, and reducing your risk of heart disease, diabetes, and certain cancers.  Gradually increase your intake of fiber. Increasing too fast can result in cramping, bloating, and gas. Drink plenty of water while you increase your fiber.  The best sources of fiber include whole fruits and vegetables, whole grains, nuts, seeds, and beans. This information is not intended to replace advice given to you by your health care provider. Make sure you discuss any questions you have with your health care provider. Document Revised: 10/05/2017 Document Reviewed: 10/05/2017 Elsevier Patient Education  2020 [employer].

## 2020-09-22 DIAGNOSIS — Z23 Encounter for immunization: Secondary | ICD-10-CM | POA: Diagnosis not present

## 2020-12-21 DIAGNOSIS — Z03818 Encounter for observation for suspected exposure to other biological agents ruled out: Secondary | ICD-10-CM | POA: Diagnosis not present

## 2021-01-04 DIAGNOSIS — I10 Essential (primary) hypertension: Secondary | ICD-10-CM | POA: Diagnosis not present

## 2021-01-04 DIAGNOSIS — I2510 Atherosclerotic heart disease of native coronary artery without angina pectoris: Secondary | ICD-10-CM | POA: Diagnosis not present

## 2021-01-04 DIAGNOSIS — N400 Enlarged prostate without lower urinary tract symptoms: Secondary | ICD-10-CM | POA: Diagnosis not present

## 2021-01-04 DIAGNOSIS — E7800 Pure hypercholesterolemia, unspecified: Secondary | ICD-10-CM | POA: Diagnosis not present

## 2021-01-04 DIAGNOSIS — E669 Obesity, unspecified: Secondary | ICD-10-CM | POA: Diagnosis not present

## 2021-01-04 DIAGNOSIS — Z0000 Encounter for general adult medical examination without abnormal findings: Secondary | ICD-10-CM | POA: Diagnosis not present

## 2021-07-04 DIAGNOSIS — R809 Proteinuria, unspecified: Secondary | ICD-10-CM | POA: Diagnosis not present

## 2021-07-09 ENCOUNTER — Other Ambulatory Visit: Payer: Self-pay | Admitting: Interventional Cardiology

## 2021-07-20 ENCOUNTER — Other Ambulatory Visit: Payer: Self-pay | Admitting: Interventional Cardiology

## 2021-08-07 NOTE — Progress Notes (Signed)
Cardiology Office Note   Date:  08/08/2021   ID:  Matthew Henderson, Matthew Henderson 1946-09-16, MRN [id number]  PCP:  Asencion Gowda.August Saucer, MD    No chief complaint on file.  CAD  Wt Readings from Last 3 Encounters:  08/08/21 220 lb 3.2 oz (99.9 kg)  07/27/20 224 lb (101.6 kg)  05/19/20 219 lb (99.3 kg)       History of Present Illness: Matthew Henderson is a 75 y.o. male   With a h/o CAD, diagnosed in 11/19.   At that time, Pt endorsed on and off CP, occurring at rest and centrally located and pressure like sensation. He also noted change in exercise tolerance, with more fatigue with activities such as playing golf.    Night of admission, he had developed more severe CP that would not resolve and no improvement with baby ASA.    In ED, EKG showed NSR, HR 83 bpm with no ischemic changes. POC troponin was negative. BP elevated at 179/97. BMP c/w renal insuffiencey w/ SCr at 1.36 (baseline ~1.0). IM admitted pt for further w/u.    Cath in 11/19 showed: Prox RCA lesion is 80% stenosed. A drug-eluting stent was successfully placed using a STENT SYNERGY DES 3X24. Post intervention, there is a 0% residual stenosis. Dist RCA lesion is 80% stenosed. A drug-eluting stent was successfully placed using a STENT SYNERGY DES 2.5X20. Post intervention, there is a 0% residual stenosis. Mid RCA lesion is 25% stenosed. Mid LAD lesion is 25% stenosed. Prox Cx lesion is 25% stenosed. The left ventricular systolic function is normal. The left ventricular ejection fraction is 50-55% by visual estimate. LV end diastolic pressure is normal. There is no aortic valve stenosis.       Recommend uninterrupted dual antiplatelet therapy with Aspirin 81mg  daily and Clopidogrel 75mg  daily for a minimum of 12 months (ACS - Class I recommendation).    Went to ER in June 2021 : "Patient states that he was playing golf on Tuesday and became lightheaded and dizzy.  Patient has been eating out more this week or so.   Patient states that his blood pressure has been slightly more elevated this week. He is on Norvasc and carvedilol and his blood pressure has been running in the 150s to 160s.  Patient also has some occasional epigastric pain but denies any nausea or vomiting or fevers.  Patient states that the dizziness is worse when he stands up but denies any focal weakness or trouble speaking.  Denies any chest pain or shortness of breath."   Plan from ER : "Patient is not orthostatic.  His urinalysis shows some proteinuria.  But his creatinine is normal.  CT head is unremarkable.  I think he stable for discharge and told him to decrease his salt intake.  I told him that if we increase his blood pressure medicine, he will get more dizzy."   Dizziness improved. BP has been controlled at home.  Readings are around 130-140 in the morning noted in 2021.  He is not sleeping well.  Trouble falling asleep and staying asleep.    Denies : Chest pain. Dizziness. Leg edema. Nitroglycerin use. Orthopnea. Palpitations. Paroxysmal nocturnal dyspnea. Shortness of breath. Syncope.      Past Medical History:  Diagnosis Date   CAD S/P percutaneous coronary angioplasty    s/p DES to prox RCA, DES to distal RCA, 11/01/18, residual mild nonobs dz in mid RCA, mid LAD and prox LCx   Hypercholesteremia  Hypertension    Prostate enlargement     Past Surgical History:  Procedure Laterality Date   CARDIAC CATHETERIZATION     CORONARY STENT INTERVENTION N/A 11/01/2018   Procedure: CORONARY STENT INTERVENTION;  Surgeon: Corky Crafts, MD;  Location: [facility] INVASIVE CV LAB;  Service: Cardiovascular;  Laterality: N/A;   HERNIA REPAIR     OVER 20 YEARS AGO   LEFT HEART CATH AND CORONARY ANGIOGRAPHY N/A 11/01/2018   Procedure: LEFT HEART CATH AND CORONARY ANGIOGRAPHY;  Surgeon: Corky Crafts, MD;  [facility] INVAS[facility] CV LAB;  Service: Cardiovascular;  Laterality: N/A;     Current Outpatient Medications  Medication  Sig Dispense Refill   carvedilol (COREG) 12.5 MG tablet Take 1 tablet (12.5 mg total) by mouth 2 (two) times daily. 180 tablet 3   clopidogrel (PLAVIX) 75 MG tablet TAKE 1 TABLET (75 MG TOTAL) BY MOUTH DAILY WITH BREAKFAST. 90 tablet 3   ezetimibe (ZETIA) 10 MG tablet Take 1 tablet (10 mg total) by mouth daily at 6 PM. 90 tablet 3   finasteride (PROSCAR) 5 MG tablet Take 5 mg by mouth daily.  3   nitroGLYCERIN (NITROSTAT) 0.4 MG SL tablet Place 1 tablet (0.4 mg total) under the tongue every 5 (five) minutes as needed for chest pain. 25 tablet 3   rosuvastatin (CRESTOR) 40 MG tablet Take 1 tablet (40 mg total) by mouth daily. 90 tablet 3   tamsulosin (FLOMAX) 0.4 MG CAPS capsule Take 0.4 mg by mouth daily.     amLODipine (NORVASC) 10 MG tablet Take 1 tablet (10 mg total) by mouth daily. 90 tablet 3   Omega-3 Fatty Acids (FISH OIL PO) Take 2 capsules by mouth daily. (Patient not taking: Reported on 08/08/2021)     zolpidem (AMBIEN) 5 MG tablet Take 1 tablet (5 mg total) by mouth at bedtime as needed for sleep. (Patient not taking: Reported on 08/08/2021) 5 tablet 0   No current facility-administered medications for this visit.    Allergies:   Patient has no known allergies.    Social History:  The patient  reports that he has never smoked. He has never used smokeless tobacco. He reports that he does not drink alcohol and does not use drugs.   Family History:  The patient's family history includes Cancer in his mother; Heart attack in his father; Heart disease in his brother; Other in his maternal grandmother.    ROS:  Please see the history of present illness.   Otherwise, review of systems are positive for difficulty sleeping.   All other systems are reviewed and negative.    PHYSICAL EXAM: VS:  BP 138/80   Pulse (!) 56   Ht 6' (1.829 m)   Wt 220 lb 3.2 oz (99.9 kg)   SpO2 98%   BMI 29.86 kg/m  , BMI Body mass index is 29.86 kg/m. GEN: Well nourished, well developed, in no acute  distress HEENT: normal Neck: no JVD, carotid bruits, or masses Cardiac: RRR; no murmurs, rubs, or gallops,no edema  Respiratory:  clear to auscultation bilaterally, normal work of breathing GI: soft, nontender, nondistended, + BS MS: no deformity or atrophy Skin: warm and dry, no rash Neuro:  Strength and sensation are intact Psych: euthymic mood, full affect   EKG:   The ekg ordered today demonstrates sinus bradycardia, no ST segment changes   Recent Labs: No results found for requested labs within last 8760 hours.   Lipid Panel    Component Value Date/Time  CHOL 119 11/02/2018 0348   TRIG 81 11/02/2018 0348   HDL 38 (L) 11/02/2018 0348   CHOLHDL 3.1 11/02/2018 0348   VLDL 16 11/02/2018 0348   LDLCALC 65 11/02/2018 0348     Other studies Reviewed: Additional studies/ records that were reviewed today with results demonstrating: LDL 84.   ASSESSMENT AND PLAN:  CAD: No angina.  Continue aggressive secondary prevention.  Increase activity to get to target below with moderate exercise. HTN: The current medical regimen is effective;  continue present plan and medications.   Hyperlipidemia: Stop atorvastatin. Start rosuvastatin 40 mg daily. Check lipids and liver tests in 2-3 months.  Avoid processed foods.  Eating whole food, plant-based diet.  LE edema: Elevate legs. Trouble sleeping: No snoring per the wife.  Better since he lost weight.  OK to try melatonin- sustained release.  Watch for symptoms of sleep apnea.  May need to consider sleep study in the future.   Current medicines are reviewed at length with the patient today.  The patient concerns regarding his medicines were addressed.  The following changes have been made:  No change  Labs/ tests ordered today include:   Orders Placed This Encounter  Procedures   Lipid panel   Comprehensive metabolic panel   CBC   EKG 12-Lead    Recommend 150 minutes/week of aerobic exercise Low fat, low carb, high fiber  diet recommended  Disposition:   FU in 1 year   Signed, Lance Muss, MD  08/08/2021 11:18 AM    [facility] [address] [place], [place], [place]  [address] Phone: [phone]; Fax: [phone]

## 2021-08-08 ENCOUNTER — Ambulatory Visit: Payer: Medicare PPO | Admitting: Interventional Cardiology

## 2021-08-08 ENCOUNTER — Encounter: Payer: Self-pay | Admitting: Interventional Cardiology

## 2021-08-08 ENCOUNTER — Other Ambulatory Visit: Payer: Self-pay | Admitting: Interventional Cardiology

## 2021-08-08 ENCOUNTER — Other Ambulatory Visit: Payer: Self-pay

## 2021-08-08 VITALS — BP 138/80 | HR 56 | Ht 72.0 in | Wt 220.2 lb

## 2021-08-08 DIAGNOSIS — R600 Localized edema: Secondary | ICD-10-CM | POA: Diagnosis not present

## 2021-08-08 DIAGNOSIS — Z9861 Coronary angioplasty status: Secondary | ICD-10-CM

## 2021-08-08 DIAGNOSIS — E782 Mixed hyperlipidemia: Secondary | ICD-10-CM

## 2021-08-08 DIAGNOSIS — I2510 Atherosclerotic heart disease of native coronary artery without angina pectoris: Secondary | ICD-10-CM

## 2021-08-08 DIAGNOSIS — I10 Essential (primary) hypertension: Secondary | ICD-10-CM | POA: Diagnosis not present

## 2021-08-08 MED ORDER — AMLODIPINE BESYLATE 10 MG PO TABS: 10.0000 mg | ORAL_TABLET | Freq: Every day | ORAL | 3 refills | Status: DC

## 2021-08-08 MED ORDER — ROSUVASTATIN CALCIUM 40 MG PO TABS
40.0000 mg | ORAL_TABLET | Freq: Every day | ORAL | 3 refills | Status: DC
Start: 2021-08-08 — End: 2022-07-25

## 2021-08-08 NOTE — Patient Instructions (Addendum)
Medication Instructions:  Your physician has recommended you make the following change in your medication:  STOP: atorvastatin (Lipitor) START:  rosuvastatin 40 mg by mouth daily  A refill for amlodipine (Norvasc) was sent to your pharmacy *If you need a refill on your cardiac medications before your next appointment, please call your pharmacy*   Lab Work: IN 3 MONTHS: FLP, CMET, CBC If you have labs (blood work) drawn today and your tests are completely normal, you will receive your results only by: MyChart Message (if you have MyChart) OR A paper copy in the mail If you have any lab test that is abnormal or we need to change your treatment, we will call you to review the results.   Testing/Procedures: NONE   Follow-Up: At [facility], you and your health needs are our priority.  As part of our continuing mission to provide you with exceptional heart care, we have created designated Provider Care Teams.  These Care Teams include your primary Cardiologist (physician) and Advanced Practice Providers (APPs -  Physician Assistants and Nurse Practitioners) who all work together to provide you with the care you need, when you need it.  We recommend signing up for the patient portal called "MyChart".  Sign up information is provided on this After Visit Summary.  MyChart is used to connect with patients for Virtual Visits (Telemedicine).  Patients are able to view lab/test results, encounter notes, upcoming appointments, etc.  Non-urgent messages can be sent to your provider as well.   To learn more about what you can do with MyChart, go to [email or web address].    Your next appointment:   1 year(s)  The format for your next appointment:   In Person  Provider:   You may see Lance Muss, MD or one of the following Advanced Practice Providers on your designated Care Team:   Ronie Spies, PA-C Jacolyn Reedy, PA-C

## 2021-08-10 ENCOUNTER — Other Ambulatory Visit: Payer: Self-pay | Admitting: Interventional Cardiology

## 2021-09-28 DIAGNOSIS — Z23 Encounter for immunization: Secondary | ICD-10-CM | POA: Diagnosis not present

## 2021-11-11 ENCOUNTER — Other Ambulatory Visit: Payer: Medicare PPO | Admitting: *Deleted

## 2021-11-11 ENCOUNTER — Other Ambulatory Visit: Payer: Self-pay

## 2021-11-11 DIAGNOSIS — E782 Mixed hyperlipidemia: Secondary | ICD-10-CM

## 2021-11-11 DIAGNOSIS — I10 Essential (primary) hypertension: Secondary | ICD-10-CM | POA: Diagnosis not present

## 2021-11-11 DIAGNOSIS — I2510 Atherosclerotic heart disease of native coronary artery without angina pectoris: Secondary | ICD-10-CM | POA: Diagnosis not present

## 2021-11-11 LAB — COMPREHENSIVE METABOLIC PANEL
ALT: 26 IU/L (ref 0–44)
AST: 23 IU/L (ref 0–40)
Albumin/Globulin Ratio: 1.7 (ref 1.2–2.2)
Albumin: 4 g/dL (ref 3.7–4.7)
Alkaline Phosphatase: 78 IU/L (ref 44–121)
BUN/Creatinine Ratio: 13 (ref 10–24)
BUN: 20 mg/dL (ref 8–27)
Bilirubin Total: 0.6 mg/dL (ref 0.0–1.2)
CO2: 25 mmol/L (ref 20–29)
Calcium: 8.8 mg/dL (ref 8.6–10.2)
Chloride: 109 mmol/L — ABNORMAL HIGH (ref 96–106)
Creatinine, Ser: 1.5 mg/dL — ABNORMAL HIGH (ref 0.76–1.27)
Globulin, Total: 2.3 g/dL (ref 1.5–4.5)
Glucose: 101 mg/dL — ABNORMAL HIGH (ref 70–99)
Potassium: 5.2 mmol/L (ref 3.5–5.2)
Sodium: 144 mmol/L (ref 134–144)
Total Protein: 6.3 g/dL (ref 6.0–8.5)
eGFR: 49 mL/min/{1.73_m2} — ABNORMAL LOW (ref >59)

## 2021-11-11 LAB — LIPID PANEL
Chol/HDL Ratio: 3 ratio (ref 0.0–5.0)
Cholesterol, Total: 126 mg/dL (ref 100–199)
HDL: 42 mg/dL (ref >39)
LDL Chol Calc (NIH): 68 mg/dL (ref 0–99)
Triglycerides: 82 mg/dL (ref 0–149)
VLDL Cholesterol Cal: 16 mg/dL (ref 5–40)

## 2021-11-11 LAB — CBC
Hematocrit: 39.3 % (ref 37.5–51.0)
Hemoglobin: 12.8 g/dL — ABNORMAL LOW (ref 13.0–17.7)
MCH: 28.5 pg (ref 26.6–33.0)
MCHC: 32.6 g/dL (ref 31.5–35.7)
MCV: 88 fL (ref 79–97)
Platelets: 158 10*3/uL (ref 150–450)
RBC: 4.49 x10E6/uL (ref 4.14–5.80)
RDW: 14.2 % (ref 11.6–15.4)
WBC: 7.4 10*3/uL (ref 3.4–10.8)

## 2022-01-06 DIAGNOSIS — I2510 Atherosclerotic heart disease of native coronary artery without angina pectoris: Secondary | ICD-10-CM | POA: Diagnosis not present

## 2022-01-06 DIAGNOSIS — N400 Enlarged prostate without lower urinary tract symptoms: Secondary | ICD-10-CM | POA: Diagnosis not present

## 2022-01-06 DIAGNOSIS — Z23 Encounter for immunization: Secondary | ICD-10-CM | POA: Diagnosis not present

## 2022-01-06 DIAGNOSIS — E7800 Pure hypercholesterolemia, unspecified: Secondary | ICD-10-CM | POA: Diagnosis not present

## 2022-01-06 DIAGNOSIS — Z0000 Encounter for general adult medical examination without abnormal findings: Secondary | ICD-10-CM | POA: Diagnosis not present

## 2022-01-06 DIAGNOSIS — I10 Essential (primary) hypertension: Secondary | ICD-10-CM | POA: Diagnosis not present

## 2022-01-06 DIAGNOSIS — R809 Proteinuria, unspecified: Secondary | ICD-10-CM | POA: Diagnosis not present

## 2022-01-06 DIAGNOSIS — E669 Obesity, unspecified: Secondary | ICD-10-CM | POA: Diagnosis not present

## 2022-02-06 ENCOUNTER — Telehealth: Payer: Self-pay | Admitting: *Deleted

## 2022-02-06 DIAGNOSIS — Z86010 Personal history of colonic polyps: Secondary | ICD-10-CM | POA: Diagnosis not present

## 2022-02-06 DIAGNOSIS — I2510 Atherosclerotic heart disease of native coronary artery without angina pectoris: Secondary | ICD-10-CM | POA: Diagnosis not present

## 2022-02-06 NOTE — Telephone Encounter (Signed)
° °  Pre-operative Risk Assessment    Patient Name: Matthew Henderson  DOB: [birth year] MRN: [id number]      Request for Surgical Clearance    Procedure:   COLONOSCOPY  H/O COLON POLYPS  Date of Surgery:  Clearance 05/28/22                                 Surgeon:  DR. Levora Angel Surgeon's Group or Practice Name:  [facility] Phone number:  [phone] Fax number:  [phone]   Type of Clearance Requested:   - Medical  - Pharmacy:  Hold Clopidogrel (Plavix) x 5 DAYS PRIOR TO PROCEDURE   Type of Anesthesia:   PROPOFOL   Additional requests/questions:    Elpidio Anis   02/06/2022, 9:37 AM

## 2022-02-07 NOTE — Telephone Encounter (Signed)
° °  Primary Cardiologist: Lance Muss, MD  Chart reviewed as part of pre-operative protocol coverage. Given past medical history and time since last visit, based on ACC/AHA guidelines, Matthew Henderson would be at acceptable risk for the planned procedure without further cardiovascular testing.   His Plavix may be held for 5 days prior to his procedure.  Please resume as soon as hemostasis is achieved.  Patient was advised that if he develops new symptoms prior to surgery to contact our office to arrange a follow-up appointment.  He verbalized understanding.  I will route this recommendation to the requesting party via Epic fax function and remove from pre-op pool.  Please call with questions.  Matthew Henderson. Matthew Meuser NP-C    02/07/2022, 2:23 PM [facility] [address] Office [phone] Fax [phone]

## 2022-02-07 NOTE — Telephone Encounter (Signed)
OK to hold plavix 5 days prior to procedure 

## 2022-02-07 NOTE — Telephone Encounter (Signed)
Nicoletta Dress 76 year old male is requesting preoperative cardiac evaluation for colonoscopy.  He has prior history of colon polyps.  He was last seen in the clinic on 08/08/2021.  During that time he continued to do well from cardiac standpoint.  He denied chest pain, dizziness, lower extremity edema, nitroglycerin use, shortness of breath, and syncope.  His main concern was trouble with sleeping.  Melatonin was recommended.  His PMH includes coronary artery disease (status post PCI with DES x to [facility] and d RCA 11/19), essential hypertension, mixed hyperlipidemia, and lower extremity edema.  His Plavix be held prior to his procedure?    Thank you for your help.  Please direct response to CV DIV preop pool.  Thomasene Ripple. Bob Daversa NP-C    02/07/2022, 1:25 PM [facility] [address] Office [phone] Fax [phone]

## 2022-05-28 DIAGNOSIS — D122 Benign neoplasm of ascending colon: Secondary | ICD-10-CM | POA: Diagnosis not present

## 2022-05-28 DIAGNOSIS — K648 Other hemorrhoids: Secondary | ICD-10-CM | POA: Diagnosis not present

## 2022-05-28 DIAGNOSIS — Z86010 Personal history of colonic polyps: Secondary | ICD-10-CM | POA: Diagnosis not present

## 2022-05-28 DIAGNOSIS — Z09 Encounter for follow-up examination after completed treatment for conditions other than malignant neoplasm: Secondary | ICD-10-CM | POA: Diagnosis not present

## 2022-05-28 DIAGNOSIS — K5730 Diverticulosis of large intestine without perforation or abscess without bleeding: Secondary | ICD-10-CM | POA: Diagnosis not present

## 2022-05-30 DIAGNOSIS — D122 Benign neoplasm of ascending colon: Secondary | ICD-10-CM | POA: Diagnosis not present

## 2022-07-14 ENCOUNTER — Other Ambulatory Visit: Payer: Self-pay | Admitting: Interventional Cardiology

## 2022-07-25 ENCOUNTER — Other Ambulatory Visit: Payer: Self-pay | Admitting: Interventional Cardiology

## 2022-08-09 ENCOUNTER — Other Ambulatory Visit: Payer: Self-pay | Admitting: Interventional Cardiology

## 2022-08-21 ENCOUNTER — Other Ambulatory Visit: Payer: Self-pay | Admitting: Interventional Cardiology

## 2022-08-30 DIAGNOSIS — U071 COVID-19: Secondary | ICD-10-CM | POA: Diagnosis not present

## 2022-09-22 ENCOUNTER — Other Ambulatory Visit: Payer: Self-pay | Admitting: Interventional Cardiology

## 2022-09-26 ENCOUNTER — Ambulatory Visit: Payer: Medicare PPO | Attending: Interventional Cardiology | Admitting: Interventional Cardiology

## 2022-09-26 ENCOUNTER — Encounter: Payer: Self-pay | Admitting: Interventional Cardiology

## 2022-09-26 VITALS — BP 140/70 | HR 66 | Ht 72.0 in | Wt 216.0 lb

## 2022-09-26 DIAGNOSIS — Z9861 Coronary angioplasty status: Secondary | ICD-10-CM

## 2022-09-26 DIAGNOSIS — R600 Localized edema: Secondary | ICD-10-CM

## 2022-09-26 DIAGNOSIS — E782 Mixed hyperlipidemia: Secondary | ICD-10-CM | POA: Diagnosis not present

## 2022-09-26 DIAGNOSIS — I10 Essential (primary) hypertension: Secondary | ICD-10-CM | POA: Diagnosis not present

## 2022-09-26 DIAGNOSIS — I2510 Atherosclerotic heart disease of native coronary artery without angina pectoris: Secondary | ICD-10-CM | POA: Diagnosis not present

## 2022-09-26 NOTE — Patient Instructions (Signed)
Medication Instructions:  Your physician recommends that you continue on your current medications as directed. Please refer to the Current Medication list given to you today.  *If you need a refill on your cardiac medications before your next appointment, please call your pharmacy*   Lab Work: NONE If you have labs (blood work) drawn today and your tests are completely normal, you will receive your results only by: [place] (if you have MyChart) OR A paper copy in the mail If you have any lab test that is abnormal or we need to change your treatment, we will call you to review the results.   Testing/Procedures: NONE   Follow-Up: At [facility], you and your health needs are our priority.  As part of our continuing mission to provide you with exceptional heart care, we have created designated Provider Care Teams.  These Care Teams include your primary Cardiologist (physician) and Advanced Practice Providers (APPs -  Physician Assistants and Nurse Practitioners) who all work together to provide you with the care you need, when you need it.  Your next appointment:   1 year(s)  The format for your next appointment:   In Person  Provider:   Larae Grooms, MD       Other Instructions Attached high fiber eating plan  Important Information About Sugar

## 2022-09-26 NOTE — Progress Notes (Signed)
Cardiology Office Note   Date:  09/26/2022   ID:  [name], [name] 10-Nov-1946, MRN [id number]  PCP:  Asencion Gowda.August Saucer, MD    No chief complaint on file.  CAD  Wt Readings from Last 3 Encounters:  09/26/22 216 lb (98 kg)  08/08/21 220 lb 3.2 oz (99.9 kg)  07/27/20 224 lb (101.6 kg)       History of Present Illness: Matthew Henderson is a 76 y.o. male  With a h/o CAD, diagnosed in 11/19.  His sister in law is [place].   At that time, Pt endorsed on and off CP, occurring at rest and centrally located and pressure like sensation. He also noted change in exercise tolerance, with more fatigue with activities such as playing golf.    Night of admission, he had developed more severe CP that would not resolve and no improvement with baby ASA.    In ED, EKG showed NSR, HR 83 bpm with no ischemic changes. POC troponin was negative. BP elevated at 179/97. BMP c/w renal insuffiencey w/ SCr at 1.36 (baseline ~1.0). IM admitted pt for further w/u.    Cath in 11/19 showed: Prox RCA lesion is 80% stenosed. A drug-eluting stent was successfully placed using a STENT SYNERGY DES 3X24. Post intervention, there is a 0% residual stenosis. Dist RCA lesion is 80% stenosed. A drug-eluting stent was successfully placed using a STENT SYNERGY DES 2.5X20. Post intervention, there is a 0% residual stenosis. Mid RCA lesion is 25% stenosed. Mid LAD lesion is 25% stenosed. Prox Cx lesion is 25% stenosed. The left ventricular systolic function is normal. The left ventricular ejection fraction is 50-55% by visual estimate. LV end diastolic pressure is normal. There is no aortic valve stenosis.       Recommend uninterrupted dual antiplatelet therapy with Aspirin 81mg  daily and Clopidogrel 75mg  daily for a minimum of 12 months (ACS - Class I recommendation).    Went to ER in June 2021 : "Patient states that he was playing golf on Tuesday and became lightheaded and dizzy.  Patient has  been eating out more this week or so.  Patient states that his blood pressure has been slightly more elevated this week. He is on Norvasc and carvedilol and his blood pressure has been running in the 150s to 160s.  Patient also has some occasional epigastric pain but denies any nausea or vomiting or fevers.  Patient states that the dizziness is worse when he stands up but denies any focal weakness or trouble speaking.  Denies any chest pain or shortness of breath."   Plan from ER : "Patient is not orthostatic.  His urinalysis shows some proteinuria.  But his creatinine is normal.  CT head is unremarkable.  I think he stable for discharge and told him to decrease his salt intake.  I told him that if we increase his blood pressure medicine, he will get more dizzy."   Dizziness improved. BP has been controlled at home.  Readings are around 130-140 in the morning noted in 2021.   In the past, he has had trouble falling asleep and staying asleep.     Denies : Chest pain.  Leg edema. Nitroglycerin use. Orthopnea. Palpitations. Paroxysmal nocturnal dyspnea. Shortness of breath. Syncope.    Rare episodes of lightheadedness, occurs randomly- every few months.  Associated with not eating, improved with eating.  No bleeding issues.      Past Medical History:  Diagnosis Date   CAD  S/P percutaneous coronary angioplasty    s/p DES to prox RCA, DES to distal RCA, 11/01/18, residual mild nonobs dz in mid RCA, mid LAD and prox LCx   Hypercholesteremia    Hypertension    Prostate enlargement     Past Surgical History:  Procedure Laterality Date   CARDIAC CATHETERIZATION     CORONARY STENT INTERVENTION N/A 11/01/2018   Procedure: CORONARY STENT INTERVENTION;  Surgeon: Corky Crafts, MD;  Location: [facility] INVASIVE CV LAB;  Service: Cardiovascular;  Laterality: N/A;   HERNIA REPAIR     OVER 20 YEARS AGO   LEFT HEART CATH AND CORONARY ANGIOGRAPHY N/A 11/01/2018   Procedure: LEFT HEART CATH AND  CORONARY ANGIOGRAPHY;  Surgeon: Corky Crafts, MD;  Location: [facility] INVASIVE CV LAB;  Service: Cardiovascular;  Laterality: N/A;     Current Outpatient Medications  Medication Sig Dispense Refill   amLODipine (NORVASC) 10 MG tablet Take 1 tablet (10 mg total) by mouth daily. 90 tablet 3   carvedilol (COREG) 12.5 MG tablet TAKE 1 TABLET BY MOUTH TWICE A DAY 30 tablet 0   clopidogrel (PLAVIX) 75 MG tablet TAKE 1 TABLET BY MOUTH EVERY DAY WITH BREAKFAST 15 tablet 0   ezetimibe (ZETIA) 10 MG tablet TAKE 1 TABLET BY MOUTH EVERY DAY AT 6PM. Please keep scheduled appointment for future refills. Thank you. 90 tablet 1   finasteride (PROSCAR) 5 MG tablet Take 5 mg by mouth daily.  3   nitroGLYCERIN (NITROSTAT) 0.4 MG SL tablet Place 1 tablet (0.4 mg total) under the tongue every 5 (five) minutes as needed for chest pain. 25 tablet 3   rosuvastatin (CRESTOR) 40 MG tablet TAKE 1 TABLET BY MOUTH EVERY DAY 90 tablet 3   tamsulosin (FLOMAX) 0.4 MG CAPS capsule Take 0.4 mg by mouth daily.     No current facility-administered medications for this visit.    Allergies:   Patient has no known allergies.    Social History:  The patient  reports that he has never smoked. He has never used smokeless tobacco. He reports that he does not drink alcohol and does not use drugs.   Family History:  The patient's family history includes Cancer in his mother; Heart attack in his father; Heart disease in his brother; Other in his maternal grandmother.    ROS:  Please see the history of present illness.   Otherwise, review of systems are positive for rare dizziness.   All other systems are reviewed and negative.    PHYSICAL EXAM: VS:  BP (!) 140/70   Pulse 66   Ht 6' (1.829 m)   Wt 216 lb (98 kg)   SpO2 97%   BMI 29.29 kg/m  , BMI Body mass index is 29.29 kg/m. GEN: Well nourished, well developed, in no acute distress HEENT: normal Neck: no JVD, carotid bruits, or masses Cardiac: RRR; no murmurs, rubs,  or gallops,tr LE edema  Respiratory:  clear to auscultation bilaterally, normal work of breathing GI: soft, nontender, nondistended, + BS MS: no deformity or atrophy Skin: warm and dry, no rash Neuro:  Strength and sensation are intact Psych: euthymic mood, full affect   EKG:   The ekg ordered today demonstrates NSR, no ST changes   Recent Labs: 11/11/2021: ALT 26; BUN 20; Creatinine, Ser 1.50; Hemoglobin 12.8; Platelets 158; Potassium 5.2; Sodium 144   Lipid Panel    Component Value Date/Time   CHOL 126 11/11/2021 0752   TRIG 82 11/11/2021 0752   HDL 42  11/11/2021 0752   CHOLHDL 3.0 11/11/2021 0752   CHOLHDL 3.1 11/02/2018 0348   VLDL 16 11/02/2018 0348   LDLCALC 68 11/11/2021 0752     Other studies Reviewed: Additional studies/ records that were reviewed today with results demonstrating: labs reviewed   ASSESSMENT AND PLAN:  CAD: No angina on medical therapy. Continue aggressive secondary prevention.  Hypertension: Home readings are in the 117-122 range.  Hyperlipidemia: LDL 68. Continue Crestor and Zetia. He has improved diet. High fiber diet recommended.  LE edema: controlled. Elevate legs.   Sleep disturbance: Discussed sleep study in the past.  Wife was instructed to watch for apnea.  Snoring has resolved with weight loss.    Current medicines are reviewed at length with the patient today.  The patient concerns regarding his medicines were addressed.  The following changes have been made:  No change  Labs/ tests ordered today include:  No orders of the defined types were placed in this encounter.   Recommend 150 minutes/week of aerobic exercise Low fat, low carb, high fiber diet recommended  Disposition:   FU in 1 year   Signed, Larae Grooms, MD  09/26/2022 1:51 PM    [facility] [place], [place], [place]  [address] Phone: [phone]; Fax: [phone]

## 2022-10-04 ENCOUNTER — Other Ambulatory Visit: Payer: Self-pay | Admitting: Interventional Cardiology

## 2022-10-04 DIAGNOSIS — Z23 Encounter for immunization: Secondary | ICD-10-CM | POA: Diagnosis not present

## 2022-10-22 ENCOUNTER — Other Ambulatory Visit: Payer: Self-pay | Admitting: Interventional Cardiology

## 2022-10-29 ENCOUNTER — Emergency Department (HOSPITAL_COMMUNITY): Payer: Medicare PPO

## 2022-10-29 ENCOUNTER — Other Ambulatory Visit: Payer: Self-pay

## 2022-10-29 ENCOUNTER — Emergency Department (HOSPITAL_COMMUNITY)
Admission: EM | Admit: 2022-10-29 | Discharge: 2022-10-29 | Discharge status: Against Medical Advice | Payer: Medicare PPO | Attending: Physician Assistant | Admitting: Physician Assistant

## 2022-10-29 ENCOUNTER — Encounter (HOSPITAL_COMMUNITY): Payer: Self-pay

## 2022-10-29 DIAGNOSIS — R55 Syncope and collapse: Secondary | ICD-10-CM | POA: Diagnosis not present

## 2022-10-29 DIAGNOSIS — R110 Nausea: Secondary | ICD-10-CM | POA: Diagnosis not present

## 2022-10-29 DIAGNOSIS — Z5321 Procedure and treatment not carried out due to patient leaving prior to being seen by health care provider: Secondary | ICD-10-CM | POA: Insufficient documentation

## 2022-10-29 DIAGNOSIS — I959 Hypotension, unspecified: Secondary | ICD-10-CM | POA: Diagnosis not present

## 2022-10-29 DIAGNOSIS — R42 Dizziness and giddiness: Secondary | ICD-10-CM | POA: Insufficient documentation

## 2022-10-29 DIAGNOSIS — R0689 Other abnormalities of breathing: Secondary | ICD-10-CM | POA: Diagnosis not present

## 2022-10-29 LAB — COMPREHENSIVE METABOLIC PANEL
ALT: 32 U/L (ref 0–44)
AST: 33 U/L (ref 15–41)
Albumin: 3.4 g/dL — ABNORMAL LOW (ref 3.5–5.0)
Alkaline Phosphatase: 50 U/L (ref 38–126)
Creatinine, Ser: 1.71 mg/dL — ABNORMAL HIGH (ref 0.61–1.24)
GFR, Estimated: 41 mL/min — ABNORMAL LOW (ref >60)
Glucose, Bld: 120 mg/dL — ABNORMAL HIGH (ref 70–99)
Total Bilirubin: 1 mg/dL (ref 0.3–1.2)
Total Protein: 6.5 g/dL (ref 6.5–8.1)

## 2022-10-29 LAB — CBC WITH DIFFERENTIAL/PLATELET
Abs Immature Granulocytes: 0.04 10*3/uL (ref 0.00–0.07)
Basophils Absolute: 0.1 10*3/uL (ref 0.0–0.1)
Basophils Relative: 1 %
Eosinophils Absolute: 0.2 10*3/uL (ref 0.0–0.5)
Eosinophils Relative: 2 %
HCT: 45.6 % (ref 39.0–52.0)
Hemoglobin: 13.4 g/dL (ref 13.0–17.0)
Immature Granulocytes: 1 %
Lymphocytes Relative: 23 %
Lymphs Abs: 1.5 10*3/uL (ref 0.7–4.0)
MCH: 29 pg (ref 26.0–34.0)
MCHC: 29.4 g/dL — ABNORMAL LOW (ref 30.0–36.0)
MCV: 98.7 fL (ref 80.0–100.0)
Monocytes Absolute: 0.6 10*3/uL (ref 0.1–1.0)
Monocytes Relative: 8 %
Neutro Abs: 4.4 10*3/uL (ref 1.7–7.7)
Neutrophils Relative %: 65 %
Platelets: 114 10*3/uL — ABNORMAL LOW (ref 150–400)
RBC: 4.62 MIL/uL (ref 4.22–5.81)
RDW: 14.7 % (ref 11.5–15.5)
WBC: 6.7 10*3/uL (ref 4.0–10.5)
nRBC: 0 % (ref 0.0–0.2)

## 2022-10-29 LAB — TROPONIN I (HIGH SENSITIVITY): Troponin I (High Sensitivity): 11 ng/L (ref <18)

## 2022-10-29 NOTE — ED Provider Triage Note (Signed)
Emergency Medicine Provider Triage Evaluation Note  Matthew Henderson , a 76 y.o. male  was evaluated in triage.  Pt complains of dizziness and nausea. Sitting at home while watching TV and had sudden onset of nausea and dizziness. Worse with movement of his head. History of vertigo about 10 years ago. Received Zofran with no improvement. No HA, CP, SOB   Review of Systems  Positive: As above  Negative: As above   Physical Exam  There were no vitals taken for this visit. Gen:   Awake, no distress   Resp:  Normal effort  MSK:   Moves extremities without difficulty  Other:  Mental Status:  Alert, thought content appropriate, able to give a coherent history. Speech fluent without evidence of aphasia. Able to follow 2 step commands without difficulty.  Cranial Nerves:  II:  Peripheral visual fields grossly normal, pupils equal, round, reactive to light III,IV, VI: ptosis not present, extra-ocular motions intact bilaterally  V,VII: smile symmetric, facial light touch sensation equal VIII: hearing grossly normal to voice  X: uvula elevates symmetrically  XI: bilateral shoulder shrug symmetric and strong XII: midline tongue extension without fassiculations Motor:  Normal tone. 5/5 strength of BUE and BLE major muscle groups including strong and equal grip strength and dorsiflexion/plantar flexion Sensory: light touch normal in all extremities. Cerebellar: normal finger-to-nose with bilateral upper extremities, Romberg sign absent Gait: not accessed   Medical Decision Making  Medically screening exam initiated at 1:29 AM.  Appropriate orders placed.  ERWIN NISHIYAMA was informed that the remainder of the evaluation will be completed by another provider, this initial triage assessment does not replace that evaluation, and the importance of remaining in the ED until their evaluation is complete.     Mare Ferrari, PA-C 10/29/22 0134

## 2022-10-29 NOTE — ED Triage Notes (Signed)
While watching tv tonight sts sudden onset of nausea and dizziness around midnight.

## 2022-10-29 NOTE — ED Notes (Signed)
Pt stated he was leaving and going to his primary in the morning. 

## 2023-01-07 DIAGNOSIS — Z0000 Encounter for general adult medical examination without abnormal findings: Secondary | ICD-10-CM | POA: Diagnosis not present

## 2023-01-07 DIAGNOSIS — I2510 Atherosclerotic heart disease of native coronary artery without angina pectoris: Secondary | ICD-10-CM | POA: Diagnosis not present

## 2023-01-07 DIAGNOSIS — N400 Enlarged prostate without lower urinary tract symptoms: Secondary | ICD-10-CM | POA: Diagnosis not present

## 2023-01-07 DIAGNOSIS — I10 Essential (primary) hypertension: Secondary | ICD-10-CM | POA: Diagnosis not present

## 2023-01-07 DIAGNOSIS — R42 Dizziness and giddiness: Secondary | ICD-10-CM | POA: Diagnosis not present

## 2023-01-07 DIAGNOSIS — E669 Obesity, unspecified: Secondary | ICD-10-CM | POA: Diagnosis not present

## 2023-01-07 DIAGNOSIS — E7800 Pure hypercholesterolemia, unspecified: Secondary | ICD-10-CM | POA: Diagnosis not present

## 2023-01-07 DIAGNOSIS — N1832 Chronic kidney disease, stage 3b: Secondary | ICD-10-CM | POA: Diagnosis not present

## 2023-01-07 DIAGNOSIS — R809 Proteinuria, unspecified: Secondary | ICD-10-CM | POA: Diagnosis not present

## 2023-01-15 ENCOUNTER — Ambulatory Visit: Payer: Medicare PPO | Admitting: Interventional Cardiology

## 2023-03-12 DIAGNOSIS — R059 Cough, unspecified: Secondary | ICD-10-CM | POA: Diagnosis not present

## 2023-03-15 ENCOUNTER — Other Ambulatory Visit: Payer: Self-pay | Admitting: Interventional Cardiology

## 2023-07-02 DIAGNOSIS — R5383 Other fatigue: Secondary | ICD-10-CM | POA: Diagnosis not present

## 2023-07-02 DIAGNOSIS — R059 Cough, unspecified: Secondary | ICD-10-CM | POA: Diagnosis not present

## 2023-07-02 DIAGNOSIS — N400 Enlarged prostate without lower urinary tract symptoms: Secondary | ICD-10-CM | POA: Diagnosis not present

## 2023-07-02 DIAGNOSIS — I10 Essential (primary) hypertension: Secondary | ICD-10-CM | POA: Diagnosis not present

## 2023-07-02 DIAGNOSIS — N1832 Chronic kidney disease, stage 3b: Secondary | ICD-10-CM | POA: Diagnosis not present

## 2023-07-11 DIAGNOSIS — R42 Dizziness and giddiness: Secondary | ICD-10-CM | POA: Diagnosis not present

## 2023-07-11 DIAGNOSIS — R053 Chronic cough: Secondary | ICD-10-CM | POA: Diagnosis not present

## 2023-07-11 DIAGNOSIS — R0982 Postnasal drip: Secondary | ICD-10-CM | POA: Diagnosis not present

## 2023-07-11 DIAGNOSIS — R5382 Chronic fatigue, unspecified: Secondary | ICD-10-CM | POA: Diagnosis not present

## 2023-07-12 ENCOUNTER — Other Ambulatory Visit: Payer: Self-pay | Admitting: Interventional Cardiology

## 2023-08-10 DIAGNOSIS — N1832 Chronic kidney disease, stage 3b: Secondary | ICD-10-CM | POA: Diagnosis not present

## 2023-08-25 DIAGNOSIS — R3121 Asymptomatic microscopic hematuria: Secondary | ICD-10-CM | POA: Diagnosis not present

## 2023-08-25 DIAGNOSIS — R351 Nocturia: Secondary | ICD-10-CM | POA: Diagnosis not present

## 2023-08-25 DIAGNOSIS — N401 Enlarged prostate with lower urinary tract symptoms: Secondary | ICD-10-CM | POA: Diagnosis not present

## 2023-08-29 DIAGNOSIS — Z23 Encounter for immunization: Secondary | ICD-10-CM | POA: Diagnosis not present

## 2023-09-01 ENCOUNTER — Other Ambulatory Visit: Payer: Self-pay | Admitting: Interventional Cardiology

## 2023-09-18 DIAGNOSIS — H5203 Hypermetropia, bilateral: Secondary | ICD-10-CM | POA: Diagnosis not present

## 2023-09-18 DIAGNOSIS — H2513 Age-related nuclear cataract, bilateral: Secondary | ICD-10-CM | POA: Diagnosis not present

## 2023-09-23 ENCOUNTER — Other Ambulatory Visit: Payer: Self-pay | Admitting: Interventional Cardiology

## 2023-10-10 ENCOUNTER — Other Ambulatory Visit: Payer: Self-pay | Admitting: Interventional Cardiology

## 2023-10-19 DIAGNOSIS — R3121 Asymptomatic microscopic hematuria: Secondary | ICD-10-CM | POA: Diagnosis not present

## 2023-10-19 DIAGNOSIS — N401 Enlarged prostate with lower urinary tract symptoms: Secondary | ICD-10-CM | POA: Diagnosis not present

## 2023-10-19 DIAGNOSIS — R351 Nocturia: Secondary | ICD-10-CM | POA: Diagnosis not present

## 2023-11-16 DIAGNOSIS — N400 Enlarged prostate without lower urinary tract symptoms: Secondary | ICD-10-CM | POA: Diagnosis not present

## 2023-11-16 DIAGNOSIS — K5730 Diverticulosis of large intestine without perforation or abscess without bleeding: Secondary | ICD-10-CM | POA: Diagnosis not present

## 2023-11-16 DIAGNOSIS — R3121 Asymptomatic microscopic hematuria: Secondary | ICD-10-CM | POA: Diagnosis not present

## 2023-11-16 DIAGNOSIS — R319 Hematuria, unspecified: Secondary | ICD-10-CM | POA: Diagnosis not present

## 2023-12-06 ENCOUNTER — Other Ambulatory Visit: Payer: Self-pay | Admitting: Interventional Cardiology

## 2023-12-20 NOTE — Progress Notes (Deleted)
 No chief complaint on file.  History of Present Illness: 78 yo male with history of CAD, HTN and HLD who is here today for follow up. He has been followed in the past by Dr. Dann. Cardiac cath in November 2019 with placement of 2 drug eluting stents in the RCA. Mild non-obstructive disease in the LAD and Circumflex. Echo in November 2019 with LVEF=55-60%. No valve disease.   He is here today for follow up. The patient denies any chest pain, dyspnea, palpitations, lower extremity edema, orthopnea, PND, dizziness, near syncope or syncope.   Primary Care Physician: Merilee, L.Addie, MD (Inactive)   Past Medical History:  Diagnosis Date   CAD S/P percutaneous coronary angioplasty    s/p DES to prox RCA, DES to distal RCA, 11/01/18, residual mild nonobs dz in mid RCA, mid LAD and prox LCx   Hypercholesteremia    Hypertension    Prostate enlargement     Past Surgical History:  Procedure Laterality Date   CARDIAC CATHETERIZATION     CORONARY STENT INTERVENTION N/A 11/01/2018   Procedure: CORONARY STENT INTERVENTION;  Surgeon: Dann Candyce RAMAN, MD;  Location: [facility] INVASIVE CV LAB;  Service: Cardiovascular;  Laterality: N/A;   HERNIA REPAIR     OVER 20 YEARS AGO   LEFT HEART CATH AND CORONARY ANGIOGRAPHY N/A 11/01/2018   Procedure: LEFT HEART CATH AND CORONARY ANGIOGRAPHY;  Surgeon: Dann Candyce RAMAN, MD;  Location: [facility] INVASIVE CV LAB;  Service: Cardiovascular;  Laterality: N/A;    Current Outpatient Medications  Medication Sig Dispense Refill   amLODipine  (NORVASC ) 10 MG tablet TAKE 1 TABLET BY MOUTH EVERY DAY 90 tablet 0   carvedilol  (COREG ) 12.5 MG tablet TAKE 1 TABLET BY MOUTH TWICE A DAY 180 tablet 1   clopidogrel  (PLAVIX ) 75 MG tablet TAKE 1 TABLET BY MOUTH EVERY DAY WITH BREAKFAST 90 tablet 0   ezetimibe  (ZETIA ) 10 MG tablet TAKE 1 TABLET BY MOUTH EVERY DAY AT 6PM. 30 tablet 0   finasteride  (PROSCAR ) 5 MG tablet Take 5 mg by mouth daily.  3   nitroGLYCERIN  (NITROSTAT )  0.4 MG SL tablet Place 1 tablet (0.4 mg total) under the tongue every 5 (five) minutes as needed for chest pain. 25 tablet 3   rosuvastatin  (CRESTOR ) 40 MG tablet TAKE 1 TABLET BY MOUTH EVERY DAY 90 tablet 1   tamsulosin (FLOMAX) 0.4 MG CAPS capsule Take 0.4 mg by mouth daily.     No current facility-administered medications for this visit.    No Known Allergies  Social History   Socioeconomic History   Marital status: Married    Spouse name: Mercer   Number of children: 0   Years of education: 16   Highest education level: Bachelor's degree (e.g., BA, AB, BS)  Occupational History   Occupation: Retired  Tobacco Use   Smoking status: Never   Smokeless tobacco: Never  Vaping Use   Vaping status: Never Used  Substance and Sexual Activity   Alcohol use: No   Drug use: No   Sexual activity: Not on file  Other Topics Concern   Not on file  Social History Narrative   Not on file   Social Drivers of Health   Financial Resource Strain: Low Risk  (12/30/2018)   Overall Financial Resource Strain (CARDIA)    Difficulty of Paying Living Expenses: Not hard at all  Food Insecurity: No Food Insecurity (12/30/2018)   Hunger Vital Sign    Worried About Running Out of Food in the  Last Year: Never true    Ran Out of Food in the Last Year: Never true  Transportation Needs: No Transportation Needs (12/30/2018)   PRAPARE - Administrator, Civil Service (Medical): No    Lack of Transportation (Non-Medical): No  Physical Activity: Inactive (12/30/2018)   Exercise Vital Sign    Days of Exercise per Week: 0 days    Minutes of Exercise per Session: 0 min  Stress: No Stress Concern Present (12/30/2018)   [employer] of Occupational Health - Occupational Stress Questionnaire    Feeling of Stress : Not at all  Social Connections: Not on file  Intimate Partner Violence: Not on file    Family History  Problem Relation Age of Onset   Cancer Mother        BREAST   Heart attack  Father    Heart disease Brother        STENTS   Other Maternal Grandmother        OLD AGE    Review of Systems:  As stated in the HPI and otherwise negative.   There were no vitals taken for this visit.  Physical Examination: General: Well developed, well nourished, NAD  HEENT: OP clear, mucus membranes moist  SKIN: warm, dry. No rashes. Neuro: No focal deficits  Musculoskeletal: Muscle strength 5/5 all ext  Psychiatric: Mood and affect normal  Neck: No JVD, no carotid bruits, no thyromegaly, no lymphadenopathy.  Lungs:Clear bilaterally, no wheezes, rhonci, crackles Cardiovascular: Regular rate and rhythm. No murmurs, gallops or rubs. Abdomen:Soft. Bowel sounds present. Non-tender.  Extremities: No lower extremity edema. Pulses are 2 + in the bilateral DP/PT.  EKG:  EKG {ACTION; IS/IS WNU:[id number]} ordered today. The ekg ordered today demonstrates ***  Recent Labs: No results found for requested labs within last 365 days.   Lipid Panel    Component Value Date/Time   CHOL 126 11/11/2021 0752   TRIG 82 11/11/2021 0752   HDL 42 11/11/2021 0752   CHOLHDL 3.0 11/11/2021 0752   CHOLHDL 3.1 11/02/2018 0348   VLDL 16 11/02/2018 0348   LDLCALC 68 11/11/2021 0752     Wt Readings from Last 3 Encounters:  10/29/22 97.5 kg  09/26/22 98 kg  08/08/21 99.9 kg      Assessment and Plan:   1. CAD without angina: No chest pain suggestive of angina. Continue Plavix , beta blocker, Zetia  and Crestor .   2. HTN: BP is well controlled. No changes today  3. HLD: LDL ***. Continue statin  Labs/ tests ordered today include:  No orders of the defined types were placed in this encounter.    Disposition:   F/U with me in ***    Signed, Lonni Cash, MD, [facility] 12/20/2023 6:00 PM    [facility] [address] [place], [place], [place]  [address] Phone: [phone]; Fax: [phone]

## 2023-12-21 ENCOUNTER — Telehealth: Payer: Self-pay | Admitting: *Deleted

## 2023-12-21 ENCOUNTER — Ambulatory Visit: Payer: Medicare PPO | Admitting: Cardiovascular Disease

## 2023-12-21 NOTE — Telephone Encounter (Signed)
 S/w pt is aware appt has been canceled and someone will call to R/S.

## 2023-12-23 ENCOUNTER — Other Ambulatory Visit: Payer: Self-pay | Admitting: Interventional Cardiology

## 2023-12-28 ENCOUNTER — Encounter: Payer: Self-pay | Admitting: Cardiovascular Disease

## 2023-12-28 ENCOUNTER — Ambulatory Visit: Payer: Medicare PPO | Attending: Cardiovascular Disease | Admitting: Cardiovascular Disease

## 2023-12-28 VITALS — BP 126/60 | HR 65 | Ht 72.0 in | Wt 215.0 lb

## 2023-12-28 DIAGNOSIS — E782 Mixed hyperlipidemia: Secondary | ICD-10-CM

## 2023-12-28 DIAGNOSIS — I10 Essential (primary) hypertension: Secondary | ICD-10-CM

## 2023-12-28 DIAGNOSIS — I2510 Atherosclerotic heart disease of native coronary artery without angina pectoris: Secondary | ICD-10-CM

## 2023-12-28 MED ORDER — NITROGLYCERIN 0.4 MG SL SUBL: 0.4000 mg | SUBLINGUAL_TABLET | SUBLINGUAL | 3 refills | Status: AC | PRN

## 2023-12-28 NOTE — Patient Instructions (Signed)
Medication Instructions:  No changes *If you need a refill on your cardiac medications before your next appointment, please call your pharmacy*   Lab Work: none If you have labs (blood work) drawn today and your tests are completely normal, you will receive your results only by: [place] (if you have MyChart) OR A paper copy in the mail If you have any lab test that is abnormal or we need to change your treatment, we will call you to review the results.   Testing/Procedures: none   Follow-Up: At [facility], you and your health needs are our priority.  As part of our continuing mission to provide you with exceptional heart care, we have created designated Provider Care Teams.  These Care Teams include your primary Cardiologist (physician) and Advanced Practice Providers (APPs -  Physician Assistants and Nurse Practitioners) who all work together to provide you with the care you need, when you need it.  We recommend signing up for the patient portal called "MyChart".  Sign up information is provided on this After Visit Summary.  MyChart is used to connect with patients for Virtual Visits (Telemedicine).  Patients are able to view lab/test results, encounter notes, upcoming appointments, etc.  Non-urgent messages can be sent to your provider as well.   To learn more about what you can do with MyChart, go to [email or web address].    Your next appointment:   12 month(s)  Provider:   Lauree Chandler, MD     Other Instructions

## 2023-12-28 NOTE — Progress Notes (Signed)
 Chief Complaint  Patient presents with   Follow-up    CAD   History of Present Illness: 78 yo male with history of CAD, HTN and HLD who is here today for follow up. He has been followed in the past by Dr. Dann. Cardiac cath in November 2019 with placement of 2 drug eluting stents in the RCA. Mild non-obstructive disease in the LAD and Circumflex. Echo in November 2019 with LVEF=55-60%. No valve disease.   He is here today for follow up. The patient denies any chest pain, dyspnea, palpitations, lower extremity edema, orthopnea, PND, dizziness, near syncope or syncope.   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   Primary Care Physician: Leonel Cole, MD  Past Medical History:  Diagnosis Date   CAD S/P percutaneous coronary angioplasty    s/p DES to prox RCA, DES to distal RCA, 11/01/18, residual mild nonobs dz in mid RCA, mid LAD and prox LCx   Hypercholesteremia    Hypertension    Prostate enlargement    Past Surgical History:  Procedure Laterality Date   CARDIAC CATHETERIZATION     CORONARY STENT INTERVENTION N/A 11/01/2018   Procedure: CORONARY STENT INTERVENTION;  Surgeon: Dann Candyce RAMAN, MD;  Location: [facility] INVASIVE CV LAB;  Service: Cardiovascular;  Laterality: N/A;   HERNIA REPAIR     OVER 20 YEARS AGO   LEFT HEART CATH AND CORONARY ANGIOGRAPHY N/A 11/01/2018   Procedure: LEFT HEART CATH AND CORONARY ANGIOGRAPHY;  Surgeon: Dann Candyce RAMAN, MD;  Location: [facility] INVASIVE CV LAB;  Service: Cardiovascular;  Laterality: N/A;    Current Outpatient Medications  Medication Sig Dispense Refill   amLODipine  (NORVASC ) 10 MG tablet TAKE 1 TABLET BY MOUTH EVERY DAY 90 tablet 0   carvedilol  (COREG ) 12.5 MG tablet TAKE 1 TABLET BY MOUTH TWICE A DAY 180 tablet 1   clopidogrel  (PLAVIX ) 75 MG tablet TAKE 1 TABLET BY MOUTH EVERY DAY WITH BREAKFAST 30 tablet 0   ezetimibe  (ZETIA ) 10 MG tablet TAKE 1 TABLET BY MOUTH EVERY DAY AT 6PM. 30 tablet 0    finasteride  (PROSCAR ) 5 MG tablet Take 5 mg by mouth daily.  3   rosuvastatin  (CRESTOR ) 40 MG tablet TAKE 1 TABLET BY MOUTH EVERY DAY 90 tablet 1   tamsulosin (FLOMAX) 0.4 MG CAPS capsule Take 0.4 mg by mouth daily.     nitroGLYCERIN  (NITROSTAT ) 0.4 MG SL tablet Place 1 tablet (0.4 mg total) under the tongue every 5 (five) minutes as needed for chest pain. 25 tablet 3   No current facility-administered medications for this visit.    No Known Allergies  Social History   Socioeconomic History   Marital status: Married    Spouse name: Mercer   Number of children: 0   Years of education: 16   Highest education level: Bachelor's degree (e.g., BA, AB, BS)  Occupational History   Occupation: Retired   Occupation: Retired Librarian, Academic  Tobacco Use   Smoking status: Never   Smokeless tobacco: Never  Vaping Use   Vaping status: Never Used  Substance and Sexual Activity   Alcohol use: No   Drug use: No   Sexual activity: Not on file  Other Topics Concern   Not on file  Social History Narrative   Not on file   Social Drivers of Health   Financial Resource Strain: Low Risk  (12/30/2018)   Overall Financial Resource Strain (CARDIA)    Difficulty of Paying Living Expenses: Not  hard at all  Food Insecurity: No Food Insecurity (12/30/2018)   Hunger Vital Sign    Worried About Running Out of Food in the Last Year: Never true    Ran Out of Food in the Last Year: Never true  Transportation Needs: No Transportation Needs (12/30/2018)   PRAPARE - Administrator, Civil Service (Medical): No    Lack of Transportation (Non-Medical): No  Physical Activity: Inactive (12/30/2018)   Exercise Vital Sign    Days of Exercise per Week: 0 days    Minutes of Exercise per Session: 0 min  Stress: No Stress Concern Present (12/30/2018)   [employer] of Occupational Health - Occupational Stress Questionnaire    Feeling of Stress : Not at all  Social Connections: Not on file   Intimate Partner Violence: Not on file    Family History  Problem Relation Age of Onset   Cancer Mother        BREAST   Heart attack Father    Heart disease Brother        STENTS   Other Maternal Grandmother        OLD AGE    Review of Systems:  As stated in the HPI and otherwise negative.   BP 126/60   Pulse 65   Ht 6' (1.829 m)   Wt 97.5 kg   SpO2 99%   BMI 29.16 kg/m   Physical Examination: General: Well developed, well nourished, NAD  HEENT: OP clear, mucus membranes moist  SKIN: warm, dry. No rashes. Neuro: No focal deficits  Musculoskeletal: Muscle strength 5/5 all ext  Psychiatric: Mood and affect normal  Neck: No JVD, no carotid bruits, no thyromegaly, no lymphadenopathy.  Lungs:Clear bilaterally, no wheezes, rhonci, crackles Cardiovascular: Regular rate and rhythm. No murmurs, gallops or rubs. Abdomen:Soft. Bowel sounds present. Non-tender.  Extremities: No lower extremity edema. Pulses are 2 + in the bilateral DP/PT.  EKG:  EKG is ordered today. The ekg ordered today demonstrates  EKG Interpretation Date/Time:  Monday December 28 2023 14:27:44 EST Ventricular Rate:  65 PR Interval:  180 QRS Duration:  82 QT Interval:  422 QTC Calculation: 438 R Axis:   -10  Text Interpretation: Normal sinus rhythm Normal ECG When compared with ECG of 29-Oct-2022 01:34, No significant change was found Confirmed by Verlin Bruckner [phone]) on 12/28/2023 2:39:50 PM    Recent Labs: No results found for requested labs within last 365 days.   Lipid Panel    Component Value Date/Time   CHOL 126 11/11/2021 0752   TRIG 82 11/11/2021 0752   HDL 42 11/11/2021 0752   CHOLHDL 3.0 11/11/2021 0752   CHOLHDL 3.1 11/02/2018 0348   VLDL 16 11/02/2018 0348   LDLCALC 68 11/11/2021 0752     Wt Readings from Last 3 Encounters:  12/28/23 97.5 kg  10/29/22 97.5 kg  09/26/22 98 kg    Assessment and Plan:   1. CAD without angina: No chest pain suggestive of angina.  Continue Plavix , beta blocker, Zetia  and Crestor .   2. HTN: BP is well controlled. No changes today  3. HLD: LDL 75 in January 2024. Continue statin. Labs to be drawn in primary care next month per pt.    Labs/ tests ordered today include:   Orders Placed This Encounter  Procedures   EKG 12-Lead   Disposition:   F/U with me in 12 months   Signed, Bruckner Verlin, MD, [facility] 12/28/2023 2:51 PM     Medical [facility]  [facility] [address] [place], [place], [place]  [address] Phone: [phone]; Fax: [phone]

## 2023-12-30 ENCOUNTER — Other Ambulatory Visit: Payer: Self-pay | Admitting: Cardiovascular Disease

## 2024-01-16 ENCOUNTER — Other Ambulatory Visit: Payer: Self-pay | Admitting: Cardiovascular Disease

## 2024-03-06 ENCOUNTER — Other Ambulatory Visit: Payer: Self-pay | Admitting: Interventional Cardiology

## 2024-04-03 ENCOUNTER — Other Ambulatory Visit: Payer: Self-pay | Admitting: Interventional Cardiology

## 2024-04-18 ENCOUNTER — Other Ambulatory Visit: Payer: Self-pay | Admitting: Nephrology

## 2024-04-18 DIAGNOSIS — N1832 Chronic kidney disease, stage 3b: Secondary | ICD-10-CM

## 2024-04-21 ENCOUNTER — Ambulatory Visit
Admission: RE | Admit: 2024-04-21 | Discharge: 2024-04-21 | Disposition: A | Discharge status: Home / Self Care | Source: Ambulatory Visit | Attending: Nephrology | Admitting: Nephrology

## 2024-04-21 DIAGNOSIS — N1832 Chronic kidney disease, stage 3b: Secondary | ICD-10-CM

## 2024-06-06 ENCOUNTER — Other Ambulatory Visit (HOSPITAL_COMMUNITY): Payer: Self-pay | Admitting: Nephrology

## 2024-06-06 DIAGNOSIS — R319 Hematuria, unspecified: Secondary | ICD-10-CM

## 2024-06-06 DIAGNOSIS — N1832 Chronic kidney disease, stage 3b: Secondary | ICD-10-CM

## 2024-06-07 ENCOUNTER — Telehealth: Payer: Self-pay | Admitting: *Deleted

## 2024-06-07 NOTE — Telephone Encounter (Signed)
 I called the pt and s/w wife (DPR) as pt was not home. Pt's wife scheduled a tele preop appt 06/10/24 for the pt as the Bx will not be scheduled until he has been cleared. Med rec and consent are done.

## 2024-06-07 NOTE — Telephone Encounter (Signed)
 I called the pt and s/w wife (DPR) as pt was not home. Pt's wife scheduled a tele preop appt 06/10/24 for the pt as the Bx will not be scheduled until he has been cleared. Med rec and consent are done.      Patient Consent for Virtual Visit        Matthew Henderson has provided verbal consent on 06/07/2024 for a virtual visit (video or telephone).   CONSENT FOR VIRTUAL VISIT FOR:  Matthew Henderson  By participating in this virtual visit I agree to the following:  I hereby voluntarily request, consent and authorize [facility] and its employed or contracted physicians, physician assistants, nurse practitioners or other licensed health care professionals (the Practitioner), to provide me with telemedicine health care services (the "Services) as deemed necessary by the treating Practitioner. I acknowledge and consent to receive the Services by the Practitioner via telemedicine. I understand that the telemedicine visit will involve communicating with the Practitioner through live audiovisual communication technology and the disclosure of certain medical information by electronic transmission. I acknowledge that I have been given the opportunity to request an in-person assessment or other available alternative prior to the telemedicine visit and am voluntarily participating in the telemedicine visit.  I understand that I have the right to withhold or withdraw my consent to the use of telemedicine in the course of my care at any time, without affecting my right to future care or treatment, and that the Practitioner or I may terminate the telemedicine visit at any time. I understand that I have the right to inspect all information obtained and/or recorded in the course of the telemedicine visit and may receive copies of available information for a reasonable fee.  I understand that some of the potential risks of receiving the Services via telemedicine include:  Delay or interruption in medical  evaluation due to technological equipment failure or disruption; Information transmitted may not be sufficient (e.g. poor resolution of images) to allow for appropriate medical decision making by the Practitioner; and/or  In rare instances, security protocols could fail, causing a breach of personal health information.  Furthermore, I acknowledge that it is my responsibility to provide information about my medical history, conditions and care that is complete and accurate to the best of my ability. I acknowledge that Practitioner's advice, recommendations, and/or decision may be based on factors not within their control, such as incomplete or inaccurate data provided by me or distortions of diagnostic images or specimens that may result from electronic transmissions. I understand that the practice of medicine is not an exact science and that Practitioner makes no warranties or guarantees regarding treatment outcomes. I acknowledge that a copy of this consent can be made available to me via my patient portal [facility] MyChart), or I can request a printed copy by calling the office of [facility].    I understand that my insurance will be billed for this visit.   I have read or had this consent read to me. I understand the contents of this consent, which adequately explains the benefits and risks of the Services being provided via telemedicine.  I have been provided ample opportunity to ask questions regarding this consent and the Services and have had my questions answered to my satisfaction. I give my informed consent for the services to be provided through the use of telemedicine in my medical care

## 2024-06-07 NOTE — Telephone Encounter (Signed)
   Pre-operative Risk Assessment    Patient Name: Matthew Henderson  DOB: [birth year] MRN: [id number]   Date of last office visit: 12/28/23 DR. McALHANY Date of next office visit: NONE   Request for Surgical Clearance    Procedure:  US  Bx KIDNEY  Date of Surgery:  Clearance TBD                                Surgeon:  NOT LISTED Surgeon's Group or Practice Name:  [facility] IN [place] [facility] Phone number:  [phone] Fax number:  [phone] ATTN: MELISSA X.   Type of Clearance Requested:   - Medical  - Pharmacy:  Hold Clopidogrel  (Plavix ) x 5 DAYS PRIOR   Type of Anesthesia:  MODERATE SEDATION   Additional requests/questions:    Bonney Niels Jest   06/07/2024, 1:51 PM

## 2024-06-07 NOTE — Telephone Encounter (Signed)
   Name: Matthew Henderson  DOB: [birth year]  MRN: [id number]  Primary Cardiologist: Lonni Cash, MD   Preoperative team, please contact this patient and set up a phone call appointment for further preoperative risk assessment. Please obtain consent and complete medication review. Thank you for your help.  I confirm that guidance regarding antiplatelet and oral anticoagulation therapy has been completed and, if necessary, noted below.  His Plavix  may be held for 5 days prior to his procedure.  Please resume as soon as hemostasis is achieved.  I also confirmed the patient resides in the state of [place] . As per [facility] Medical Board telemedicine laws, the patient must reside in the state in which the provider is licensed.   Josefa CHRISTELLA Beauvais, NP 06/07/2024, 2:04 PM [facility]

## 2024-06-10 ENCOUNTER — Ambulatory Visit: Attending: Cardiology

## 2024-06-10 DIAGNOSIS — Z01810 Encounter for preprocedural cardiovascular examination: Secondary | ICD-10-CM

## 2024-06-10 NOTE — Progress Notes (Signed)
 Virtual Visit via Telephone Note   Because of Matthew Henderson co-morbid illnesses, he is at least at moderate risk for complications without adequate follow up.  This format is felt to be most appropriate for this patient at this time.  Due to technical limitations with video connection (technology), today's appointment will be conducted as an audio only telehealth visit, and Matthew Henderson verbally agreed to proceed in this manner.   All issues noted in this document were discussed and addressed.  No physical exam could be performed with this format.  Evaluation Performed:  Preoperative cardiovascular risk assessment _____________   Date:  06/10/2024   Patient ID:  Matthew, Henderson 1946/10/20, MRN [id number] Patient Location:  Home Provider location:   Office  Primary Care Provider:  Leonel Cole, MD Primary Cardiologist:  Lonni Cash, MD  Chief Complaint / Patient Profile  78 y.o. y/o male with a h/o CAD s/p placement of 2 DES in the RCA in 2019, hypertension and hyperlipidemia who is pending ultrasound biopsy of the kidney and presents today for telephonic preoperative cardiovascular risk assessment. History of Present Illness  Matthew Henderson is a 78 y.o. male who presents via audio/video conferencing for a telehealth visit today.  Pt was last seen in cardiology clinic on 12/28/2023 by Dr. Cash.  At that time Matthew STEBNER was doing well.  The patient is now pending procedure as outlined above. Since his last visit, he has remained stable from a cardiac standpoint.  He is able to achieve greater than 4 METS of activity, he regularly golfs and is able to run and walk outdoors. Today denies chest pain, shortness of breath, lower extremity edema, fatigue, palpitations, melena, hematuria, hemoptysis, diaphoresis, weakness, presyncope, syncope, orthopnea, and PND.  Past Medical History    Past Medical History:  Diagnosis Date   CAD S/P percutaneous coronary  angioplasty    s/p DES to prox RCA, DES to distal RCA, 11/01/18, residual mild nonobs dz in mid RCA, mid LAD and prox LCx   Hypercholesteremia    Hypertension    Prostate enlargement    Past Surgical History:  Procedure Laterality Date   CARDIAC CATHETERIZATION     CORONARY STENT INTERVENTION N/A 11/01/2018   Procedure: CORONARY STENT INTERVENTION;  Surgeon: Dann Candyce RAMAN, MD;  Location: [facility] INVASIVE CV LAB;  Service: Cardiovascular;  Laterality: N/A;   HERNIA REPAIR     OVER 20 YEARS AGO   LEFT HEART CATH AND CORONARY ANGIOGRAPHY N/A 11/01/2018   Procedure: LEFT HEART CATH AND CORONARY ANGIOGRAPHY;  Surgeon: Dann Candyce RAMAN, MD;  Location: [facility] INVASIVE CV LAB;  Service: Cardiovascular;  Laterality: N/A;   Allergies No Known Allergies Home Medications    Prior to Admission medications   Medication Sig Start Date End Date Taking? Authorizing Provider  amLODipine  (NORVASC ) 10 MG tablet TAKE 1 TABLET BY MOUTH EVERY DAY 10/12/23   Dann Candyce RAMAN, MD  carvedilol  (COREG ) 12.5 MG tablet TAKE 1 TABLET BY MOUTH TWICE A DAY 03/07/24   Cash Lonni BIRCH, MD  clopidogrel  (PLAVIX ) 75 MG tablet TAKE 1 TABLET BY MOUTH EVERY DAY WITH BREAKFAST 01/19/24   Cash Lonni BIRCH, MD  ezetimibe  (ZETIA ) 10 MG tablet TAKE 1 TABLET BY MOUTH EVERY DAY AT [facility] 12/31/23   Cash Lonni BIRCH, MD  finasteride  (PROSCAR ) 5 MG tablet Take 5 mg by mouth daily. 07/09/18   [provider]  nitroGLYCERIN  (NITROSTAT ) 0.4 MG SL tablet Place 1 tablet (0.4 mg total) under  the tongue every 5 (five) minutes as needed for chest pain. 12/28/23   Verlin Lonni BIRCH, MD  rosuvastatin  (CRESTOR ) 40 MG tablet TAKE 1 TABLET BY MOUTH EVERY DAY 04/05/24   Verlin Lonni BIRCH, MD  tamsulosin (FLOMAX) 0.4 MG CAPS capsule Take 0.4 mg by mouth daily.    [provider]   Physical Exam  Vital Signs:  RANI SISNEY does not have vital signs available for review today. Given telephonic  nature of communication, physical exam is limited. AAOx3. NAD. Normal affect.  Speech and respirations are unlabored. Accessory Clinical Findings  None Assessment & Plan  1.  Preoperative Cardiovascular Risk Assessment: Mr. Matthew Henderson perioperative risk of a major cardiac event is 6.6% according to the Revised Cardiac Risk Index (RCRI).  His functional capacity is excellent at 8.42 METs according to the Duke Activity Status Index (DASI). Recommendations: According to ACC/AHA guidelines, no further cardiovascular testing needed.  The patient may proceed to surgery at acceptable risk.   Antiplatelet and/or Anticoagulation Recommendations: Clopidogrel  (Plavix ) can be held for 5 days prior to his surgery and resumed as soon as possible post op.  The patient was advised that if he develops new symptoms prior to surgery to contact our office to arrange for a follow-up visit, and he verbalized understanding.  A copy of this note will be routed to requesting surgeon.  Time:   Today, I have spent 9 minutes with the patient with telehealth technology discussing medical history, symptoms, and management plan.    Liyanna Cartwright D Lovett Coffin, NP  06/10/2024, 9:52 AM

## 2024-06-29 ENCOUNTER — Other Ambulatory Visit: Payer: Self-pay | Admitting: Radiology

## 2024-06-29 ENCOUNTER — Other Ambulatory Visit: Payer: Self-pay | Admitting: Student

## 2024-06-29 DIAGNOSIS — E782 Mixed hyperlipidemia: Secondary | ICD-10-CM

## 2024-06-30 ENCOUNTER — Ambulatory Visit (HOSPITAL_COMMUNITY)
Admission: RE | Admit: 2024-06-30 | Discharge: 2024-06-30 | Disposition: A | Discharge status: Home / Self Care | Source: Ambulatory Visit | Attending: Nephrology | Admitting: Nephrology

## 2024-06-30 ENCOUNTER — Other Ambulatory Visit: Payer: Self-pay

## 2024-06-30 DIAGNOSIS — I129 Hypertensive chronic kidney disease with stage 1 through stage 4 chronic kidney disease, or unspecified chronic kidney disease: Secondary | ICD-10-CM | POA: Diagnosis not present

## 2024-06-30 DIAGNOSIS — E782 Mixed hyperlipidemia: Secondary | ICD-10-CM

## 2024-06-30 DIAGNOSIS — Z7902 Long term (current) use of antithrombotics/antiplatelets: Secondary | ICD-10-CM | POA: Insufficient documentation

## 2024-06-30 DIAGNOSIS — I2510 Atherosclerotic heart disease of native coronary artery without angina pectoris: Secondary | ICD-10-CM | POA: Diagnosis not present

## 2024-06-30 DIAGNOSIS — E7800 Pure hypercholesterolemia, unspecified: Secondary | ICD-10-CM | POA: Diagnosis not present

## 2024-06-30 DIAGNOSIS — R319 Hematuria, unspecified: Secondary | ICD-10-CM | POA: Insufficient documentation

## 2024-06-30 DIAGNOSIS — N1832 Chronic kidney disease, stage 3b: Secondary | ICD-10-CM | POA: Diagnosis not present

## 2024-06-30 DIAGNOSIS — R809 Proteinuria, unspecified: Secondary | ICD-10-CM | POA: Insufficient documentation

## 2024-06-30 LAB — CBC
HCT: 39.7 % (ref 39.0–52.0)
Hemoglobin: 12.6 g/dL — ABNORMAL LOW (ref 13.0–17.0)
MCH: 28.3 pg (ref 26.0–34.0)
MCHC: 31.7 g/dL (ref 30.0–36.0)
MCV: 89.2 fL (ref 80.0–100.0)
Platelets: 160 K/uL (ref 150–400)
RBC: 4.45 MIL/uL (ref 4.22–5.81)
RDW: 14.1 % (ref 11.5–15.5)
WBC: 8 K/uL (ref 4.0–10.5)
nRBC: 0 % (ref 0.0–0.2)

## 2024-06-30 LAB — PROTIME-INR
INR: 1 (ref 0.8–1.2)
Prothrombin Time: 14 s (ref 11.4–15.2)

## 2024-06-30 MED ORDER — OXYCODONE HCL 5 MG PO TABS: 10.0000 mg | ORAL_TABLET | ORAL | Status: DC | PRN

## 2024-06-30 MED ORDER — LIDOCAINE HCL (PF) 1 % IJ SOLN
10.0000 mL | Freq: Once | INTRAMUSCULAR | Status: AC
  Administered 2024-06-30: 10 mL via INTRADERMAL

## 2024-06-30 MED ORDER — SODIUM CHLORIDE 0.9% FLUSH: 3.0000 mL | INTRAVENOUS | Status: DC | PRN

## 2024-06-30 MED ORDER — FENTANYL CITRATE (PF) 100 MCG/2ML IJ SOLN
INTRAMUSCULAR | Status: AC | PRN
  Administered 2024-06-30: 25 ug via INTRAVENOUS
  Administered 2024-06-30: 50 ug via INTRAVENOUS

## 2024-06-30 MED ORDER — GELATIN ABSORBABLE 12-7 MM EX MISC
1.0000 | Freq: Once | CUTANEOUS | Status: AC
  Administered 2024-06-30: 1 via TOPICAL

## 2024-06-30 MED ORDER — ONDANSETRON HCL 4 MG/2ML IJ SOLN
4.0000 mg | Freq: Once | INTRAMUSCULAR | Status: AC
  Administered 2024-06-30: 4 mg via INTRAVENOUS
  Filled 2024-06-30: qty 2

## 2024-06-30 MED ORDER — SODIUM CHLORIDE 0.9% FLUSH: 3.0000 mL | Freq: Two times a day (BID) | INTRAVENOUS | Status: DC

## 2024-06-30 MED ORDER — MIDAZOLAM HCL 2 MG/2ML IJ SOLN
INTRAMUSCULAR | Status: AC
  Filled 2024-06-30: qty 2

## 2024-06-30 MED ORDER — MIDAZOLAM HCL 2 MG/2ML IJ SOLN
INTRAMUSCULAR | Status: AC | PRN
Start: 2024-06-30 — End: 2024-06-30
  Administered 2024-06-30: 1 mg via INTRAVENOUS
  Administered 2024-06-30: .5 mg via INTRAVENOUS

## 2024-06-30 MED ORDER — FENTANYL CITRATE (PF) 100 MCG/2ML IJ SOLN
INTRAMUSCULAR | Status: AC
  Filled 2024-06-30: qty 2

## 2024-06-30 NOTE — H&P (Signed)
 Chief Complaint: Proteinuria  Referring Provider(s): Upton,Elizabeth   Supervising Physician: Hughes Simmonds  Patient Status: [facility] - Out-pt  History of Present Illness: Matthew Henderson is a 78 y.o. male with HLD, CAD, CKD who is followed by [facility]. He was last seen 6/27 by nephrology. At that time, a renal biopsy was requested to further workup proteinuria.   Confirms NPO since MN and ride/supervision available for 24 hours.  He has held his plavix  since Friday.   Denies fever, chills, SOB, CP, sore throat, N/V, abd pain, gross blood in stool or urine, abnormal bruising, leg swelling, back pain.   Allergies Reviewed:  Patient has no known allergies.   Patient is Full Code  Past Medical History:  Diagnosis Date   CAD S/P percutaneous coronary angioplasty    s/p DES to prox RCA, DES to distal RCA, 11/01/18, residual mild nonobs dz in mid RCA, mid LAD and prox LCx   Hypercholesteremia    Hypertension    Prostate enlargement     Past Surgical History:  Procedure Laterality Date   CARDIAC CATHETERIZATION     CORONARY STENT INTERVENTION N/A 11/01/2018   Procedure: CORONARY STENT INTERVENTION;  Surgeon: Dann Candyce RAMAN, MD;  Location: [facility] INVASIVE CV LAB;  Service: Cardiovascular;  Laterality: N/A;   HERNIA REPAIR     OVER 20 YEARS AGO   LEFT HEART CATH AND CORONARY ANGIOGRAPHY N/A 11/01/2018   Procedure: LEFT HEART CATH AND CORONARY ANGIOGRAPHY;  Surgeon: Dann Candyce RAMAN, MD;  Location: [facility] INVASIVE CV LAB;  Service: Cardiovascular;  Laterality: N/A;      Medications: Prior to Admission medications   Medication Sig Start Date End Date Taking? Authorizing Provider  amLODipine  (NORVASC ) 10 MG tablet TAKE 1 TABLET BY MOUTH EVERY DAY 10/12/23  Yes Dann Candyce RAMAN, MD  carvedilol  (COREG ) 12.5 MG tablet TAKE 1 TABLET BY MOUTH TWICE A DAY 03/07/24  Yes Verlin Lonni BIRCH, MD  ezetimibe  (ZETIA ) 10 MG tablet TAKE 1 TABLET BY MOUTH EVERY  DAY AT [facility] 12/31/23  Yes Verlin Lonni BIRCH, MD  finasteride  (PROSCAR ) 5 MG tablet Take 5 mg by mouth daily. 07/09/18  Yes [provider]  rosuvastatin  (CRESTOR ) 40 MG tablet TAKE 1 TABLET BY MOUTH EVERY DAY 04/05/24  Yes Verlin Lonni BIRCH, MD  tamsulosin (FLOMAX) 0.4 MG CAPS capsule Take 0.4 mg by mouth daily.   Yes [provider]  clopidogrel  (PLAVIX ) 75 MG tablet TAKE 1 TABLET BY MOUTH EVERY DAY WITH BREAKFAST 01/19/24   Verlin Lonni BIRCH, MD  nitroGLYCERIN  (NITROSTAT ) 0.4 MG SL tablet Place 1 tablet (0.4 mg total) under the tongue every 5 (five) minutes as needed for chest pain. 12/28/23   Verlin Lonni BIRCH, MD     Family History  Problem Relation Age of Onset   Cancer Mother        BREAST   Heart attack Father    Heart disease Brother        STENTS   Other Maternal Grandmother        OLD AGE    Social History   Socioeconomic History   Marital status: Married    Spouse name: Mercer   Number of children: 0   Years of education: 16   Highest education level: Bachelor's degree (e.g., BA, AB, BS)  Occupational History   Occupation: Retired   Occupation: Retired Librarian, academic  Tobacco Use   Smoking status: Never   Smokeless tobacco: Never  Building services engineer  status: Never Used  Substance and Sexual Activity   Alcohol use: No   Drug use: No   Sexual activity: Not on file  Other Topics Concern   Not on file  Social History Narrative   Not on file   Social Drivers of Health   Financial Resource Strain: Low Risk  (12/30/2018)   Overall Financial Resource Strain (CARDIA)    Difficulty of Paying Living Expenses: Not hard at all  Food Insecurity: No Food Insecurity (12/30/2018)   Hunger Vital Sign    Worried About Running Out of Food in the Last Year: Never true    Ran Out of Food in the Last Year: Never true  Transportation Needs: No Transportation Needs (12/30/2018)   PRAPARE - Administrator, Civil Service  (Medical): No    Lack of Transportation (Non-Medical): No  Physical Activity: Inactive (12/30/2018)   Exercise Vital Sign    Days of Exercise per Week: 0 days    Minutes of Exercise per Session: 0 min  Stress: No Stress Concern Present (12/30/2018)   [employer] of Occupational Health - Occupational Stress Questionnaire    Feeling of Stress : Not at all  Social Connections: Not on file     Review of Systems: A 12 point ROS discussed and pertinent positives are indicated in the HPI above.  All other systems are negative.    Vital Signs: BP (!) 151/83   Pulse 71   Temp 97.8 F (36.6 C) (Oral)   Resp 20   Ht 6' (1.829 m)   Wt 204 lb (92.5 kg)   SpO2 99%   BMI 27.67 kg/m   Repeated BP 139/77 @0745    Physical Exam HENT:     Mouth/Throat:     Mouth: Mucous membranes are moist.     Pharynx: Oropharynx is clear.  Cardiovascular:     Rate and Rhythm: Normal rate.     Pulses: Normal pulses.     Heart sounds: Normal heart sounds.  Pulmonary:     Effort: Pulmonary effort is normal.     Breath sounds: Normal breath sounds.  Abdominal:     General: There is no distension.     Palpations: Abdomen is soft.     Tenderness: There is no abdominal tenderness.  Musculoskeletal:     Right lower leg: No edema.     Left lower leg: No edema.     Comments: No rash or wounds over planned puncture region, bilateral flanks nontender  Skin:    General: Skin is warm and dry.  Neurological:     Mental Status: He is alert and oriented to person, place, and time.  Psychiatric:        Mood and Affect: Mood normal.        Behavior: Behavior normal.        Thought Content: Thought content normal.        Judgment: Judgment normal.     Imaging: No results found.  Labs:  CBC: Recent Labs    06/30/24 0620  WBC 8.0  HGB 12.6*  HCT 39.7  PLT 160    COAGS: Recent Labs    06/30/24 0620  INR 1.0    BMP: No results for input(s): NA, K, CL, CO2, GLUCOSE, BUN,  CALCIUM , CREATININE, GFRNONAA, GFRAA in the last 8760 hours.  Invalid input(s): CMP  LIVER FUNCTION TESTS: No results for input(s): BILITOT, AST, ALT, ALKPHOS, PROT, ALBUMIN in the last 8760 hours.  TUMOR MARKERS: No results  for input(s): AFPTM, CEA, CA199, CHROMGRNA in the last 8760 hours.  Assessment and Plan:  Request for  image guided random renal biopsy No contraindications for procedure identified in ROS, physical exam, or review of pre-sedation considerations. Labs reviewed and within acceptable range 5/8 US  renal imaging available and reviewed VSS, afebrile Plavix  held since Friday Abx not indicated    Risks and benefits of renal was discussed with the patient and/or patient's family including, but not limited to bleeding, infection, damage to adjacent structures or low yield requiring additional tests.  All of the questions were answered and there is agreement to proceed.  Consent signed and in chart.   Thank you for allowing our service to participate in Matthew Henderson 's care.    Electronically Signed: Laymon Coast, NP   06/30/2024, 7:57 AM     I spent a total of  15 Minutes   in face to face in clinical consultation, greater than 50% of which was counseling/coordinating care for image guided random renal biopsy.   (A copy of this note was sent to the referring provider and the time of visit.)

## 2024-06-30 NOTE — Progress Notes (Signed)
 Patient and wife was given discharge instructions. Both verbalized understanding.

## 2024-06-30 NOTE — Procedures (Signed)
 Vascular and Interventional Radiology Procedure Note  Patient: Matthew Henderson DOB: 04-13-1946 Medical Record Number: [id number] Note Date/Time: 06/30/24 8:13 AM   Performing Physician: Thom Hall, MD Assistant(s): None  Diagnosis: Proteinuria   Procedure: RENAL BIOPSY  Anesthesia: Conscious Sedation Complications: None Estimated Blood Loss: Minimal Specimens: Sent for Pathology  Findings:  Successful Ultrasound-guided biopsy of kidney. A total of 3 samples were obtained. Hemostasis of the tract was achieved using Manual Pressure.  Plan: Bed rest for 2 hours.  See detailed procedure note with images in PACS. The patient tolerated the procedure well without incident or complication and was returned to Recovery in stable condition.    Thom Hall, MD [facility]   Pager. [phone] Clinic. [phone]

## 2024-07-05 ENCOUNTER — Encounter (HOSPITAL_COMMUNITY): Payer: Self-pay

## 2024-07-05 LAB — SURGICAL PATHOLOGY

## 2024-09-05 ENCOUNTER — Telehealth: Payer: Self-pay | Admitting: *Deleted

## 2024-09-05 NOTE — Telephone Encounter (Signed)
 Call from [facility], [place] at [phone] ext [phone]. Asking for status of the referral sent on 08/25/24. Requests return call with status. Notified [facility] navigator to f/u with Darryle Law.

## 2024-09-06 NOTE — Progress Notes (Signed)
 Contacted Dr Tod CMA regarding referral made to [facility]. Gave Dr Maryla recommendations: This is typically treated by nephrology in an academic setting and hematology is involved to r/o hematologic malignancy as a cause of the cryoglobulinemia if autoimmune conditions and Infectious issues like Hep C are ruled out.  Office staff to update Dr Gearline and have her contact Dr onesimo for questions.

## 2024-11-08 ENCOUNTER — Other Ambulatory Visit (HOSPITAL_COMMUNITY): Payer: Self-pay | Admitting: Nephrology

## 2024-11-08 DIAGNOSIS — D891 Cryoglobulinemia: Secondary | ICD-10-CM

## 2024-11-16 ENCOUNTER — Other Ambulatory Visit (HOSPITAL_COMMUNITY): Payer: Self-pay | Admitting: Student

## 2024-11-16 DIAGNOSIS — N1832 Chronic kidney disease, stage 3b: Secondary | ICD-10-CM

## 2024-11-16 NOTE — Progress Notes (Signed)
 Patient for IR Bone Marrow Biopsy on Thurs 11/17/24, I called and spoke with the patient on the phone and gave pre-procedure instructions. Pt was made aware to be here at 7:30a, NPO after MN prior to procedure as well as driver post procedure/recovery/discharge. Pt stated understanding.  Called 11/16/24

## 2024-11-16 NOTE — H&P (Signed)
 Chief Complaint: Patient was seen in consultation today for cryoglobulinemic glomerulonephritis (HCV-), with consideration for bone marrow biopsy and aspiration.  Referring Provider(s): Dr. Almarie Bonine, MD   Supervising Physician: Jenna Hacker  Patient Status: [facility] - In-pt  Patient is Full Code  History of Present Illness: Matthew Henderson is a 78 y.o. male  with PMHx notable for glomerulonephritis, HTN, HLD, CAD s/p PCA, and BPH.  Patient is know to IR service, having most recently undergone Left renal biopsy on 06/30/2024 by Dr. 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  Subsequent extensive secondary workup included 24-hour urine and serologies, which returned negative for Hep C and monoclonal proteins.  In light of this, patient has been urgently referred to hematology [facility]) for further workup and hematologic malignancy rule out - appointment in March 2026.  Patient has been recommended for bone marrow biopsy, and is in agreement with plan.  Interventional Radiology was requested for bone marrow biopsy and aspiration. Patient is scheduled for same in IR today.   Patient is alert and laying in bed, calm. His wife is at the bedside. Patient is currently without any significant complaints. He feels well. Patient denies any fevers, headache, chest pain, SOB, cough, abdominal pain, nausea, vomiting or bleeding.     Past Medical History:  Diagnosis Date   CAD S/P percutaneous coronary angioplasty    s/p DES to prox RCA, DES to distal RCA, 11/01/18, residual mild nonobs dz in mid RCA, mid LAD and prox LCx   Hypercholesteremia    Hypertension    Prostate enlargement     Past Surgical History:  Procedure Laterality Date   CARDIAC CATHETERIZATION     CORONARY STENT INTERVENTION N/A 11/01/2018   Procedure: CORONARY STENT INTERVENTION;  Surgeon: Dann Candyce RAMAN, MD;  Location: [facility] INVASIVE CV LAB;  Service: Cardiovascular;  Laterality: N/A;   HERNIA  REPAIR     OVER 20 YEARS AGO   LEFT HEART CATH AND CORONARY ANGIOGRAPHY N/A 11/01/2018   Procedure: LEFT HEART CATH AND CORONARY ANGIOGRAPHY;  Surgeon: Dann Candyce RAMAN, MD;  Location: [facility] INVASIVE CV LAB;  Service: Cardiovascular;  Laterality: N/A;    Allergies: Patient has no known allergies.  Medications: Prior to Admission medications   Medication Sig Start Date End Date Taking? Authorizing Provider  amLODipine  (NORVASC ) 10 MG tablet TAKE 1 TABLET BY MOUTH EVERY DAY 10/12/23   Dann Candyce RAMAN, MD  carvedilol  (COREG ) 12.5 MG tablet TAKE 1 TABLET BY MOUTH TWICE A DAY 03/07/24   Verlin Lonni BIRCH, MD  clopidogrel  (PLAVIX ) 75 MG tablet TAKE 1 TABLET BY MOUTH EVERY DAY WITH BREAKFAST 01/19/24   Verlin Lonni BIRCH, MD  ezetimibe  (ZETIA ) 10 MG tablet TAKE 1 TABLET BY MOUTH EVERY DAY AT [facility] 12/31/23   Verlin Lonni BIRCH, MD  finasteride  (PROSCAR ) 5 MG tablet Take 5 mg by mouth daily. 07/09/18   [provider]  nitroGLYCERIN  (NITROSTAT ) 0.4 MG SL tablet Place 1 tablet (0.4 mg total) under the tongue every 5 (five) minutes as needed for chest pain. 12/28/23   Verlin Lonni BIRCH, MD  rosuvastatin  (CRESTOR ) 40 MG tablet TAKE 1 TABLET BY MOUTH EVERY DAY 04/05/24   Verlin Lonni BIRCH, MD  tamsulosin (FLOMAX) 0.4 MG CAPS capsule Take 0.4 mg by mouth daily.    [provider]     Family History  Problem Relation Age of Onset   Cancer Mother        BREAST   Heart attack Father  Heart disease Brother        STENTS   Other Maternal Grandmother        OLD AGE    Social History   Socioeconomic History   Marital status: Married    Spouse name: Mercer   Number of children: 0   Years of education: 16   Highest education level: Bachelor's degree (e.g., BA, AB, BS)  Occupational History   Occupation: Retired   Occupation: Retired Librarian, Academic  Tobacco Use   Smoking status: Never   Smokeless tobacco: Never  Vaping Use   Vaping status:  Never Used  Substance and Sexual Activity   Alcohol use: No   Drug use: No   Sexual activity: Not on file  Other Topics Concern   Not on file  Social History Narrative   Lives at home with wife.    Social Drivers of Corporate Investment Banker Strain: Low Risk  (12/30/2018)   Overall Financial Resource Strain (CARDIA)    Difficulty of Paying Living Expenses: Not hard at all  Food Insecurity: No Food Insecurity (12/30/2018)   Hunger Vital Sign    Worried About Running Out of Food in the Last Year: Never true    Ran Out of Food in the Last Year: Never true  Transportation Needs: No Transportation Needs (12/30/2018)   PRAPARE - Administrator, Civil Service (Medical): No    Lack of Transportation (Non-Medical): No  Physical Activity: Inactive (12/30/2018)   Exercise Vital Sign    Days of Exercise per Week: 0 days    Minutes of Exercise per Session: 0 min  Stress: No Stress Concern Present (12/30/2018)   [employer] of Occupational Health - Occupational Stress Questionnaire    Feeling of Stress : Not at all  Social Connections: Not on file     Review of Systems: A 12 point ROS discussed and pertinent positives are indicated in the HPI above.  All other systems are negative.  Vital Signs: BP 121/76   Pulse 81   Temp (!) 97.5 F (36.4 C) (Temporal)   Resp 20   SpO2 99%   Advance Care Plan: The advanced care place/surrogate decision maker was discussed at the time of visit and the patient did not wish to discuss or was not able to name a surrogate decision maker or provide an advance care plan.  Physical Exam Vitals reviewed.  Constitutional:      General: He is not in acute distress.    Appearance: Normal appearance.  HENT:     Mouth/Throat:     Mouth: Mucous membranes are dry.  Cardiovascular:     Rate and Rhythm: Normal rate and regular rhythm.     Pulses: Normal pulses.     Heart sounds: Normal heart sounds.  Pulmonary:     Effort: Pulmonary effort  is normal.     Breath sounds: Normal breath sounds.  Abdominal:     General: Abdomen is flat.  Musculoskeletal:        General: Normal range of motion.     Cervical back: Normal range of motion.  Skin:    General: Skin is warm and dry.  Neurological:     Mental Status: He is alert and oriented to person, place, and time.  Psychiatric:        Mood and Affect: Mood normal.        Behavior: Behavior normal.        Thought Content: Thought content normal.  Judgment: Judgment normal.     Imaging: No results found.  Labs:  CBC: Recent Labs    06/30/24 0620 11/17/24 0746  WBC 8.0 7.5  HGB 12.6* 12.5*  HCT 39.7 38.1*  PLT 160 143*    COAGS: Recent Labs    06/30/24 0620  INR 1.0    BMP: No results for input(s): NA, K, CL, CO2, GLUCOSE, BUN, CALCIUM , CREATININE, GFRNONAA, GFRAA in the last 8760 hours.  Invalid input(s): CMP  LIVER FUNCTION TESTS: No results for input(s): BILITOT, AST, ALT, ALKPHOS, PROT, ALBUMIN in the last 8760 hours.  TUMOR MARKERS: No results for input(s): AFPTM, CEA, CA199, CHROMGRNA in the last 8760 hours.  Assessment and Plan: Patient is know to IR service, having most recently undergone Left renal biopsy on 06/30/2024 by Dr. 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  Subsequent extensive secondary workup included 24-hour urine and serologies, which returned negative for Hep C and monoclonal proteins.  In light of this, patient has been urgently referred to hematology [facility]) for further workup and hematologic malignancy rule out - appointment in March 2026.  Patient has been recommended for bone marrow biopsy, and is in agreement with plan.  Patient presents for scheduled bone marrow biopsy and aspiration in IR today.  Patient has been NPO since midnight.  All labs and medications are within acceptable parameters.  No known allergies on file.  Risks and benefits of bone marrow  biopsy and aspiration was discussed with the patient and/or patient's family including, but not limited to bleeding, infection, damage to adjacent structures or low yield requiring additional tests.  All of the questions were answered and there is agreement to proceed.  Consent signed and in chart.    Thank you for allowing our service to participate in Matthew Henderson 's care.  Electronically Signed: Carlin DELENA Griffon, PA-C   11/17/2024, 8:19 AM      I spent a total of 40 Minutes in face to face in clinical consultation, greater than 50% of which was counseling/coordinating care for cryoglobulinemic glomerulonephritis (HCV-), with consideration for bone marrow biopsy and aspiration.

## 2024-11-17 ENCOUNTER — Encounter: Payer: Self-pay | Admitting: Radiology

## 2024-11-17 ENCOUNTER — Ambulatory Visit
Admission: RE | Admit: 2024-11-17 | Discharge: 2024-11-17 | Disposition: A | Source: Ambulatory Visit | Attending: Nephrology | Admitting: Nephrology

## 2024-11-17 ENCOUNTER — Other Ambulatory Visit: Payer: Self-pay

## 2024-11-17 DIAGNOSIS — N1832 Chronic kidney disease, stage 3b: Secondary | ICD-10-CM

## 2024-11-17 DIAGNOSIS — D891 Cryoglobulinemia: Secondary | ICD-10-CM

## 2024-11-17 HISTORY — PX: IR BONE MARROW BIOPSY & ASPIRATION: IMG5727

## 2024-11-17 LAB — CBC WITH DIFFERENTIAL/PLATELET
Abs Immature Granulocytes: 0.06 K/uL (ref 0.00–0.07)
Basophils Absolute: 0 K/uL (ref 0.0–0.1)
Basophils Relative: 1 %
Eosinophils Absolute: 0.2 K/uL (ref 0.0–0.5)
Eosinophils Relative: 2 %
HCT: 38.1 % — ABNORMAL LOW (ref 39.0–52.0)
Hemoglobin: 12.5 g/dL — ABNORMAL LOW (ref 13.0–17.0)
Immature Granulocytes: 1 %
Lymphocytes Relative: 30 %
Lymphs Abs: 2.3 K/uL (ref 0.7–4.0)
MCH: 28.6 pg (ref 26.0–34.0)
MCHC: 32.8 g/dL (ref 30.0–36.0)
MCV: 87.2 fL (ref 80.0–100.0)
Monocytes Absolute: 0.6 K/uL (ref 0.1–1.0)
Monocytes Relative: 8 %
Neutro Abs: 4.4 K/uL (ref 1.7–7.7)
Neutrophils Relative %: 58 %
Platelets: 143 K/uL — ABNORMAL LOW (ref 150–400)
RBC: 4.37 MIL/uL (ref 4.22–5.81)
RDW: 14.4 % (ref 11.5–15.5)
WBC: 7.5 K/uL (ref 4.0–10.5)
nRBC: 0 % (ref 0.0–0.2)

## 2024-11-17 MED ORDER — MIDAZOLAM HCL 2 MG/2ML IJ SOLN
INTRAMUSCULAR | Status: AC
  Filled 2024-11-17: qty 2

## 2024-11-17 MED ORDER — FENTANYL CITRATE (PF) 100 MCG/2ML IJ SOLN
INTRAMUSCULAR | Status: AC
  Filled 2024-11-17: qty 2

## 2024-11-17 MED ORDER — ONDANSETRON HCL 4 MG/2ML IJ SOLN
INTRAMUSCULAR | Status: AC
  Filled 2024-11-17: qty 2

## 2024-11-17 MED ORDER — FENTANYL CITRATE (PF) 100 MCG/2ML IJ SOLN
INTRAMUSCULAR | Status: AC | PRN
  Administered 2024-11-17: 50 ug via INTRAVENOUS

## 2024-11-17 MED ORDER — SODIUM CHLORIDE 0.9 % IV SOLN: INTRAVENOUS | Status: DC

## 2024-11-17 MED ORDER — LIDOCAINE 1 % OPTIME INJ - NO CHARGE
8.0000 mL | Freq: Once | INTRAMUSCULAR | Status: AC
  Administered 2024-11-17: 8 mL via INTRADERMAL
  Filled 2024-11-17: qty 8

## 2024-11-17 MED ORDER — HEPARIN SOD (PORK) LOCK FLUSH 100 UNIT/ML IV SOLN
INTRAVENOUS | Status: AC
  Filled 2024-11-17: qty 5

## 2024-11-17 MED ORDER — ONDANSETRON HCL 4 MG/2ML IJ SOLN
4.0000 mg | Freq: Once | INTRAMUSCULAR | Status: AC
  Administered 2024-11-17: 4 mg via INTRAVENOUS

## 2024-11-17 MED ORDER — MIDAZOLAM HCL 5 MG/5ML IJ SOLN
INTRAMUSCULAR | Status: AC | PRN
  Administered 2024-11-17: 1 mg via INTRAVENOUS

## 2024-11-17 NOTE — Progress Notes (Signed)
 Patient clinically stable post IR BMB per Dr Jenna, tolerated well. Vitals stable post procedure. Received Versed  1 mg along with Fentanyl  50 mcg IV for procedure. Report given to Grayce Ann Rn post procedure/specials/15

## 2024-11-17 NOTE — Progress Notes (Signed)
 Pt. States nausea completely gone now. Stable for DC home. BMB puncture site clean, dry, intact without any complications at site.

## 2024-11-17 NOTE — Progress Notes (Signed)
 Pt. Med. With zofran  4 mg slow iVP for c/o nausea post procedure.

## 2024-11-17 NOTE — Procedures (Signed)
 Interventional Radiology Procedure Note  Procedure: Fluoro guided Bone Marrow biopsy (iliac bone)  Complications: None  Estimated Blood Loss: < 10 mL  Findings: Fluoro guided bone marrow biopsy of the left iliac bone.  Aspirate and core samples sent to pathology for further processing.  Cordella DELENA Banner, MD

## 2024-11-22 LAB — SURGICAL PATHOLOGY

## 2024-11-24 ENCOUNTER — Encounter (HOSPITAL_COMMUNITY): Payer: Self-pay

## 2024-12-14 ENCOUNTER — Other Ambulatory Visit: Payer: Self-pay | Admitting: Cardiovascular Disease

## 2024-12-14 MED ORDER — EZETIMIBE 10 MG PO TABS: ORAL_TABLET | ORAL | 0 refills | Status: AC

## 2024-12-28 NOTE — Progress Notes (Signed)
 "   Chief Complaint  Patient presents with   Follow-up    CAD   History of Present Illness: 79 yo male with history of CAD, HTN and HLD who is here today for follow up. He has been followed in the past by Dr. Dann. Cardiac cath in November 2019 with placement of 2 drug eluting stents in the RCA. Mild non-obstructive disease in the LAD and Circumflex. Echo in November 2019 with LVEF=55-60%. No valve disease.   He is here today for follow up. The patient denies any chest pain, dyspnea, palpitations, lower extremity edema, orthopnea, PND, dizziness, near syncope or syncope. He has some fatigue. He is dealing with kidney issues and is now seeing a Mudlogger.   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   Primary Care Physician: Leonel Cole, MD  Past Medical History:  Diagnosis Date   CAD S/P percutaneous coronary angioplasty    s/p DES to prox RCA, DES to distal RCA, 11/01/18, residual mild nonobs dz in mid RCA, mid LAD and prox LCx   Hypercholesteremia    Hypertension    Prostate enlargement    Past Surgical History:  Procedure Laterality Date   CARDIAC CATHETERIZATION     CORONARY STENT INTERVENTION N/A 11/01/2018   Procedure: CORONARY STENT INTERVENTION;  Surgeon: Dann Candyce RAMAN, MD;  Location: [facility] INVASIVE CV LAB;  Service: Cardiovascular;  Laterality: N/A;   HERNIA REPAIR     OVER 20 YEARS AGO   IR BONE MARROW BIOPSY & ASPIRATION  11/17/2024   LEFT HEART CATH AND CORONARY ANGIOGRAPHY N/A 11/01/2018   Procedure: LEFT HEART CATH AND CORONARY ANGIOGRAPHY;  Surgeon: Dann Candyce RAMAN, MD;  Location: [facility] INVASIVE CV LAB;  Service: Cardiovascular;  Laterality: N/A;    Current Outpatient Medications  Medication Sig Dispense Refill   amLODipine  (NORVASC ) 10 MG tablet TAKE 1 TABLET BY MOUTH EVERY DAY 90 tablet 0   carvedilol  (COREG ) 12.5 MG tablet TAKE 1 TABLET BY MOUTH TWICE A DAY 180 tablet 3   clopidogrel  (PLAVIX ) 75 MG tablet TAKE 1 TABLET  BY MOUTH EVERY DAY WITH BREAKFAST 90 tablet 3   ezetimibe  (ZETIA ) 10 MG tablet TAKE 1 TABLET BY MOUTH EVERY DAY AT 6PM. 90 tablet 0   finasteride  (PROSCAR ) 5 MG tablet Take 5 mg by mouth daily.  3   nitroGLYCERIN  (NITROSTAT ) 0.4 MG SL tablet Place 1 tablet (0.4 mg total) under the tongue every 5 (five) minutes as needed for chest pain. 25 tablet 3   rosuvastatin  (CRESTOR ) 40 MG tablet TAKE 1 TABLET BY MOUTH EVERY DAY 90 tablet 0   tamsulosin (FLOMAX) 0.4 MG CAPS capsule Take 0.4 mg by mouth daily.     No current facility-administered medications for this visit.    No Known Allergies  Social History   Socioeconomic History   Marital status: Married    Spouse name: Mercer   Number of children: 0   Years of education: 16   Highest education level: Bachelor's degree (e.g., BA, AB, BS)  Occupational History   Occupation: Retired   Occupation: Retired Librarian, Academic  Tobacco Use   Smoking status: Never   Smokeless tobacco: Never  Vaping Use   Vaping status: Never Used  Substance and Sexual Activity   Alcohol use: No   Drug use: No   Sexual activity: Not on file  Other Topics Concern   Not on file  Social History Narrative   Lives at home with wife.  Social Drivers of Health   Tobacco Use: Low Risk (12/29/2024)   Patient History    Smoking Tobacco Use: Never    Smokeless Tobacco Use: Never    Passive Exposure: Not on file  Financial Resource Strain: Not on file  Food Insecurity: Not on file  Transportation Needs: Not on file  Physical Activity: Not on file  Stress: Not on file  Social Connections: Not on file  Intimate Partner Violence: Not on file  Depression (EYV7-0): Not on file  Alcohol Screen: Not on file  Housing: Not on file  Utilities: Not on file  Health Literacy: Not on file    Family History  Problem Relation Age of Onset   Cancer Mother        BREAST   Heart attack Father    Heart disease Brother        STENTS   Other Maternal Grandmother         OLD AGE    Review of Systems:  As stated in the HPI and otherwise negative.   BP 136/68   Pulse (!) 57   Ht 5' 11 (1.803 m)   Wt 215 lb (97.5 kg)   SpO2 98%   BMI 29.99 kg/m   Physical Examination:  General: Well developed, well nourished, NAD  SKIN: warm, dry. Neuro: No focal deficits  Psychiatric: Mood and affect normal  Neck: No JVD Lungs:Clear bilaterally, no wheezes, rhonci, crackles Cardiovascular: Regular rate and rhythm. No murmurs, gallops or rubs. Abdomen:Soft.  Extremities: No lower extremity edema.    EKG:  EKG is ordered today. The ekg ordered today demonstrates  EKG Interpretation Date/Time:  Thursday December 29 2024 09:00:48 EST Ventricular Rate:  59 PR Interval:  192 QRS Duration:  82 QT Interval:  432 QTC Calculation: 427 R Axis:   -2  Text Interpretation: Sinus bradycardia When compared with ECG of 28-Dec-2023 14:27, No significant change was found Confirmed by Verlin Bruckner [phone]) on 12/29/2024 9:07:26 AM   Recent Labs: 11/17/2024: Hemoglobin 12.5; Platelets 143   Lipid Panel    Component Value Date/Time   CHOL 126 11/11/2021 0752   TRIG 82 11/11/2021 0752   HDL 42 11/11/2021 0752   CHOLHDL 3.0 11/11/2021 0752   CHOLHDL 3.1 11/02/2018 0348   VLDL 16 11/02/2018 0348   LDLCALC 68 11/11/2021 0752     Wt Readings from Last 3 Encounters:  12/29/24 215 lb (97.5 kg)  06/30/24 204 lb (92.5 kg)  12/28/23 215 lb (97.5 kg)    Assessment and Plan:   1. CAD without angina: No chest pain.  -Continue Plavix , Crestor , Zetia  and Coreg .  -Will arrange an echo to assess LV function with ongoing fatigue   2. HTN: BP is well controlled. -Continue Norvasc  and Coreg .   3. HLD: LDL 69 in August 2025.  -Continue Crestor .     Labs/ tests ordered today include:   Orders Placed This Encounter  Procedures   EKG 12-Lead   ECHOCARDIOGRAM COMPLETE   Disposition:   F/U with me in 12 months  Signed, Bruckner Verlin, MD,  [facility] 12/29/2024 9:55 AM    [facility] [address] [place], [place], [place]  [address] Phone: [phone]; Fax: [phone]    "

## 2024-12-29 ENCOUNTER — Other Ambulatory Visit (HOSPITAL_COMMUNITY): Payer: Self-pay | Admitting: Nephrology

## 2024-12-29 ENCOUNTER — Ambulatory Visit: Attending: Cardiovascular Disease | Admitting: Cardiovascular Disease

## 2024-12-29 ENCOUNTER — Encounter: Payer: Self-pay | Admitting: Cardiovascular Disease

## 2024-12-29 VITALS — BP 136/68 | HR 57 | Ht 71.0 in | Wt 215.0 lb

## 2024-12-29 DIAGNOSIS — I10 Essential (primary) hypertension: Secondary | ICD-10-CM | POA: Diagnosis not present

## 2024-12-29 DIAGNOSIS — I2510 Atherosclerotic heart disease of native coronary artery without angina pectoris: Secondary | ICD-10-CM | POA: Diagnosis not present

## 2024-12-29 DIAGNOSIS — E782 Mixed hyperlipidemia: Secondary | ICD-10-CM

## 2024-12-29 DIAGNOSIS — D891 Cryoglobulinemia: Secondary | ICD-10-CM | POA: Insufficient documentation

## 2024-12-29 NOTE — Patient Instructions (Signed)
 Medication Instructions:  Your physician recommends that you continue on your current medications as directed. Please refer to the Current Medication list given to you today.  *If you need a refill on your cardiac medications before your next appointment, please call your pharmacy*  Lab Work: none If you have labs (blood work) drawn today and your tests are completely normal, you will receive your results only by: MyChart Message (if you have MyChart) OR A paper copy in the mail If you have any lab test that is abnormal or we need to change your treatment, we will call you to review the results.  Testing/Procedures: Your physician has requested that you have an echocardiogram. Echocardiography is a painless test that uses sound waves to create images of your heart. It provides your doctor with information about the size and shape of your heart and how well your heart's chambers and valves are working. This procedure takes approximately one hour. There are no restrictions for this procedure. Please do NOT wear cologne, perfume, aftershave, or lotions (deodorant is allowed). Please arrive 15 minutes prior to your appointment time.  Please note: We ask at that you not bring children with you during ultrasound (echo/ vascular) testing. Due to room size and safety concerns, children are not allowed in the ultrasound rooms during exams. Our front office staff cannot provide observation of children in our lobby area while testing is being conducted. An adult accompanying a patient to their appointment will only be allowed in the ultrasound room at the discretion of the ultrasound technician under special circumstances. We apologize for any inconvenience.   Follow-Up: At [facility], you and your health needs are our priority.  As part of our continuing mission to provide you with exceptional heart care, our providers are all part of one team.  This team includes your primary Cardiologist  (physician) and Advanced Practice Providers or APPs (Physician Assistants and Nurse Practitioners) who all work together to provide you with the care you need, when you need it.  Your next appointment:   12 month(s)  Provider:   Lonni Cash, MD    We recommend signing up for the patient portal called MyChart.  Sign up information is provided on this After Visit Summary.  MyChart is used to connect with patients for Virtual Visits (Telemedicine).  Patients are able to view lab/test results, encounter notes, upcoming appointments, etc.  Non-urgent messages can be sent to your provider as well.   To learn more about what you can do with MyChart, go to [email or web address].   Other Instructions

## 2024-12-30 ENCOUNTER — Telehealth: Payer: Self-pay

## 2024-12-30 NOTE — Telephone Encounter (Signed)
 Dr. Gearline, I am working on submitting a prior auth for Truxima. I need chart notes to send to [facility].

## 2025-01-09 ENCOUNTER — Telehealth: Payer: Self-pay

## 2025-01-09 NOTE — Telephone Encounter (Signed)
 Dr. Gearline, patient will be scheduled as soon as possible.  Auth Submission: APPROVED Site of care: Site of care: [facility] Payer: [employer] medicare Medication & CPT/J Code(s) submitted: Truxima (Rituximab-abbs) [id number] Diagnosis Code:  Route of submission (phone, fax, portal): portal Phone # Fax # Auth type: Buy/Bill PB Units/visits requested: 1000mg  x 3 doses Reference number: [id number] Approval from: 12/30/24 to 12/14/25

## 2025-01-12 ENCOUNTER — Other Ambulatory Visit: Payer: Self-pay | Admitting: Cardiovascular Disease

## 2025-01-19 ENCOUNTER — Ambulatory Visit

## 2025-01-23 ENCOUNTER — Other Ambulatory Visit (HOSPITAL_BASED_OUTPATIENT_CLINIC_OR_DEPARTMENT_OTHER)

## 2025-01-27 ENCOUNTER — Ambulatory Visit

## 2025-02-02 ENCOUNTER — Ambulatory Visit

## 2025-02-13 ENCOUNTER — Ambulatory Visit
